# Patient Record
Sex: Female | Born: 1995 | Race: White | Hispanic: No | Marital: Married | Smoking: Former smoker
Health system: Southern US, Community
[De-identification: ages and names within clinical notes are randomized; demographics above are authoritative.]

## PROBLEM LIST (undated history)

## (undated) DIAGNOSIS — N926 Irregular menstruation, unspecified: Secondary | ICD-10-CM

## (undated) DIAGNOSIS — R0602 Shortness of breath: Secondary | ICD-10-CM

## (undated) DIAGNOSIS — M549 Dorsalgia, unspecified: Secondary | ICD-10-CM

## (undated) DIAGNOSIS — F430 Acute stress reaction: Secondary | ICD-10-CM

## (undated) DIAGNOSIS — E7800 Pure hypercholesterolemia, unspecified: Secondary | ICD-10-CM

## (undated) DIAGNOSIS — R5383 Other fatigue: Secondary | ICD-10-CM

## (undated) DIAGNOSIS — F32A Depression, unspecified: Secondary | ICD-10-CM

## (undated) DIAGNOSIS — F410 Panic disorder [episodic paroxysmal anxiety] without agoraphobia: Secondary | ICD-10-CM

## (undated) DIAGNOSIS — M238X1 Other internal derangements of right knee: Secondary | ICD-10-CM

## (undated) DIAGNOSIS — I471 Supraventricular tachycardia, unspecified: Secondary | ICD-10-CM

## (undated) DIAGNOSIS — E282 Polycystic ovarian syndrome: Secondary | ICD-10-CM

## (undated) DIAGNOSIS — K589 Irritable bowel syndrome without diarrhea: Secondary | ICD-10-CM

## (undated) DIAGNOSIS — R7303 Prediabetes: Secondary | ICD-10-CM

## (undated) DIAGNOSIS — N83209 Unspecified ovarian cyst, unspecified side: Secondary | ICD-10-CM

## (undated) DIAGNOSIS — F419 Anxiety disorder, unspecified: Secondary | ICD-10-CM

## (undated) DIAGNOSIS — F319 Bipolar disorder, unspecified: Secondary | ICD-10-CM

## (undated) DIAGNOSIS — M238X2 Other internal derangements of left knee: Secondary | ICD-10-CM

## (undated) DIAGNOSIS — F329 Major depressive disorder, single episode, unspecified: Secondary | ICD-10-CM

## (undated) HISTORY — DX: Shortness of breath: R06.02

## (undated) HISTORY — DX: Other internal derangements of right knee: M23.8X1

## (undated) HISTORY — DX: Other fatigue: R53.83

## (undated) HISTORY — DX: Dorsalgia, unspecified: M54.9

## (undated) HISTORY — DX: Polycystic ovarian syndrome: E28.2

## (undated) HISTORY — DX: Irritable bowel syndrome, unspecified: K58.9

## (undated) HISTORY — PX: NO PAST SURGERIES: SHX2092

## (undated) HISTORY — DX: Pure hypercholesterolemia, unspecified: E78.00

## (undated) HISTORY — DX: Prediabetes: R73.03

## (undated) HISTORY — DX: Irregular menstruation, unspecified: N92.6

## (undated) HISTORY — DX: Bipolar disorder, unspecified: F31.9

## (undated) HISTORY — DX: Other internal derangements of left knee: M23.8X2

---
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
## 2017-03-03 ENCOUNTER — Other Ambulatory Visit (INDEPENDENT_AMBULATORY_CARE_PROVIDER_SITE_OTHER): Payer: 59

## 2017-03-03 ENCOUNTER — Encounter: Payer: Self-pay | Admitting: Obstetrics and Gynecology

## 2017-03-03 ENCOUNTER — Ambulatory Visit (INDEPENDENT_AMBULATORY_CARE_PROVIDER_SITE_OTHER): Payer: 59 | Admitting: Obstetrics and Gynecology

## 2017-03-03 ENCOUNTER — Other Ambulatory Visit: Payer: Self-pay | Admitting: Obstetrics and Gynecology

## 2017-03-03 VITALS — BP 120/80 | HR 100 | Ht 70.0 in | Wt 176.0 lb

## 2017-03-03 DIAGNOSIS — R1032 Left lower quadrant pain: Secondary | ICD-10-CM | POA: Diagnosis not present

## 2017-03-03 DIAGNOSIS — R197 Diarrhea, unspecified: Secondary | ICD-10-CM

## 2017-03-03 DIAGNOSIS — R102 Pelvic and perineal pain: Secondary | ICD-10-CM | POA: Diagnosis not present

## 2017-03-03 DIAGNOSIS — N926 Irregular menstruation, unspecified: Secondary | ICD-10-CM | POA: Diagnosis not present

## 2017-03-03 NOTE — Progress Notes (Signed)
Chief Complaint  Patient presents with  . Pelvic Pain    HPI:      Annette Blevins is a 21 y.o. G0P0000 who LMP was Patient's last menstrual period was 01/27/2017., presents today for LLQ pain for the past wk. She is late for menses this month and has had multiple neg UPTs, including one at Urgent care yesterday. Pain is achey with sharp cramps occasionally. She has also had nausea, diarrhea and felt feverish for the past wk. No foreign travel, unusual foods, raw foods. No vag or urin sx.  Menses are usually monthly. She is sex active, no new partners since I last saw her 7/18. Conception ok and pt is taking PNVs.    Past Medical History:  Diagnosis Date  . Depression   . Irregular menses   . Panic attack as reaction to stress     Past Surgical History:  Procedure Laterality Date  . NO PAST SURGERIES      History reviewed. No pertinent family history.  Social History   Social History  . Marital status: Single    Spouse name: N/A  . Number of children: N/A  . Years of education: N/A   Occupational History  . Not on file.   Social History Main Topics  . Smoking status: Current Some Day Smoker  . Smokeless tobacco: Never Used  . Alcohol use Yes  . Drug use: Yes    Types: Marijuana     Comment: FORMER  . Sexual activity: Yes    Birth control/ protection: None   Other Topics Concern  . Not on file   Social History Narrative  . No narrative on file    No current outpatient prescriptions on file.   ROS:  Review of Systems  Constitutional: Positive for fever. Negative for unexpected weight change.  Gastrointestinal: Positive for diarrhea and nausea. Negative for blood in stool, constipation and vomiting.  Genitourinary: Positive for pelvic pain. Negative for dyspareunia, dysuria, flank pain, frequency, hematuria, urgency, vaginal bleeding, vaginal discharge and vaginal pain.  Musculoskeletal: Negative for back pain.  Skin: Negative for rash.      OBJECTIVE:   Vitals:  BP 120/80   Pulse 100   Ht 5\' 10"  (1.778 m)   Wt 176 lb (79.8 kg)   LMP 01/27/2017   BMI 25.25 kg/m   Physical Exam  Constitutional: She is oriented to person, place, and time and well-developed, well-nourished, and in no distress. Vital signs are normal.  Abdominal: Soft. Normal appearance. There is generalized tenderness and tenderness in the left lower quadrant. There is no rebound.  Genitourinary: Vagina normal, uterus normal, cervix normal, right adnexa normal and vulva normal. Uterus is not enlarged. Cervix exhibits no motion tenderness and no tenderness. Right adnexum displays no mass and no tenderness. Left adnexum displays tenderness. Left adnexum displays no mass. Vulva exhibits no erythema, no exudate, no lesion, no rash and no tenderness. Vagina exhibits no lesion.  Neurological: She is oriented to person, place, and time.  Vitals reviewed.   Results: GYN U/S-->    Assessment/Plan: LLQ pain - Tender on exam. GYN u/s with small amt free fluid, no ovar path. Question GI related. F/u prn.   Late menses - Neg UPTs. See if menses comes late or next month. F/u if no menses next mo for provera Rx after neg UPT. Conception ok.  Diarrhea, unspecified type - F/u with PCP prn sx.     Return if symptoms worsen or fail to  improve.  Goku Harb B. Corinthian Kemler, PA-C 03/03/2017 11:56 AM

## 2017-04-19 ENCOUNTER — Telehealth: Payer: Self-pay | Admitting: Obstetrics and Gynecology

## 2017-04-19 ENCOUNTER — Other Ambulatory Visit: Payer: Self-pay | Admitting: Obstetrics and Gynecology

## 2017-04-19 MED ORDER — LEVONORGESTREL-ETHINYL ESTRAD 0.1-20 MG-MCG PO TABS: 1.0000 | ORAL_TABLET | Freq: Every day | ORAL | 2 refills | Status: DC

## 2017-04-19 NOTE — Telephone Encounter (Signed)
Spoke with pt. Wants monthly OCPs. Rx aviane. Start with next menses. Back up for 1 mo. F/u prn.

## 2017-04-19 NOTE — Progress Notes (Signed)
OCP start with next menses for [facility]. Rx aviane. F/u prn.

## 2017-04-19 NOTE — Telephone Encounter (Signed)
Pt is calling to speak with Annette Blevins. Pt is thinking about change her birthcontrol and would like for Dr. Patsy Lageropland to call her. Please advise

## 2017-06-19 ENCOUNTER — Encounter: Payer: Self-pay | Admitting: Emergency Medicine

## 2017-06-19 DIAGNOSIS — R1013 Epigastric pain: Secondary | ICD-10-CM | POA: Diagnosis present

## 2017-06-19 DIAGNOSIS — F17200 Nicotine dependence, unspecified, uncomplicated: Secondary | ICD-10-CM | POA: Insufficient documentation

## 2017-06-19 DIAGNOSIS — K2900 Acute gastritis without bleeding: Secondary | ICD-10-CM | POA: Diagnosis not present

## 2017-06-19 DIAGNOSIS — R1012 Left upper quadrant pain: Secondary | ICD-10-CM | POA: Diagnosis not present

## 2017-06-19 DIAGNOSIS — Z79899 Other long term (current) drug therapy: Secondary | ICD-10-CM | POA: Insufficient documentation

## 2017-06-19 LAB — URINALYSIS, COMPLETE (UACMP) WITH MICROSCOPIC
BILIRUBIN URINE: NEGATIVE
Bacteria, UA: NONE SEEN
Glucose, UA: NEGATIVE mg/dL
Ketones, ur: 5 mg/dL — AB
Leukocytes, UA: NEGATIVE
Nitrite: NEGATIVE
PROTEIN: NEGATIVE mg/dL
Specific Gravity, Urine: 1.008 (ref 1.005–1.030)
pH: 5 (ref 5.0–8.0)

## 2017-06-19 LAB — COMPREHENSIVE METABOLIC PANEL
ALK PHOS: 75 U/L (ref 38–126)
ALT: 15 U/L (ref 14–54)
ANION GAP: 8 (ref 5–15)
AST: 22 U/L (ref 15–41)
Albumin: 4.5 g/dL (ref 3.5–5.0)
BUN: 12 mg/dL (ref 6–20)
CALCIUM: 9.7 mg/dL (ref 8.9–10.3)
CO2: 25 mmol/L (ref 22–32)
Chloride: 105 mmol/L (ref 101–111)
Creatinine, Ser: 0.76 mg/dL (ref 0.44–1.00)
GFR calc non Af Amer: >60 mL/min (ref >60)
Glucose, Bld: 107 mg/dL — ABNORMAL HIGH (ref 65–99)
POTASSIUM: 3.8 mmol/L (ref 3.5–5.1)
SODIUM: 138 mmol/L (ref 135–145)
Total Bilirubin: 0.6 mg/dL (ref 0.3–1.2)
Total Protein: 7.9 g/dL (ref 6.5–8.1)

## 2017-06-19 LAB — CBC
HEMATOCRIT: 42.4 % (ref 35.0–47.0)
HEMOGLOBIN: 14.5 g/dL (ref 12.0–16.0)
MCH: 29.3 pg (ref 26.0–34.0)
MCHC: 34.2 g/dL (ref 32.0–36.0)
MCV: 85.9 fL (ref 80.0–100.0)
Platelets: 303 10*3/uL (ref 150–440)
RBC: 4.94 MIL/uL (ref 3.80–5.20)
RDW: 13.8 % (ref 11.5–14.5)
WBC: 7 10*3/uL (ref 3.6–11.0)

## 2017-06-19 LAB — LIPASE, BLOOD: Lipase: 29 U/L (ref 11–51)

## 2017-06-19 LAB — POCT PREGNANCY, URINE: PREG TEST UR: NEGATIVE

## 2017-06-19 NOTE — ED Triage Notes (Signed)
Patient with complaint of upper abdominal pain that started last night. Patient reports vomiting times one and one loose stool.

## 2017-06-20 ENCOUNTER — Emergency Department
Admission: EM | Admit: 2017-06-20 | Discharge: 2017-06-20 | Disposition: A | Discharge status: Home / Self Care | Payer: 59 | Attending: Emergency Medicine | Admitting: Emergency Medicine

## 2017-06-20 ENCOUNTER — Emergency Department: Payer: 59

## 2017-06-20 DIAGNOSIS — R1013 Epigastric pain: Secondary | ICD-10-CM

## 2017-06-20 DIAGNOSIS — K2900 Acute gastritis without bleeding: Secondary | ICD-10-CM

## 2017-06-20 HISTORY — DX: Unspecified ovarian cyst, unspecified side: N83.209

## 2017-06-20 LAB — MONONUCLEOSIS SCREEN: MONO SCREEN: NEGATIVE

## 2017-06-20 MED ORDER — DEXTROSE-NACL 5-0.45 % IV SOLN
INTRAVENOUS | Status: DC
  Administered 2017-06-20: 02:00:00 via INTRAVENOUS

## 2017-06-20 MED ORDER — FAMOTIDINE IN NACL 20-0.9 MG/50ML-% IV SOLN
20.0000 mg | Freq: Once | INTRAVENOUS | Status: AC
  Administered 2017-06-20: 20 mg via INTRAVENOUS
  Filled 2017-06-20: qty 50

## 2017-06-20 MED ORDER — FAMOTIDINE 20 MG PO TABS: 20.0000 mg | ORAL_TABLET | Freq: Two times a day (BID) | ORAL | 0 refills | Status: DC

## 2017-06-20 MED ORDER — ONDANSETRON HCL 4 MG/2ML IJ SOLN
4.0000 mg | Freq: Once | INTRAMUSCULAR | Status: AC
  Administered 2017-06-20: 4 mg via INTRAVENOUS
  Filled 2017-06-20: qty 2

## 2017-06-20 NOTE — Discharge Instructions (Signed)
1.  Start Pepcid 20 mg twice daily. 2.  Eat a bland diet for the next 5 days, then slowly advance diet as tolerated.  Avoid heavy, greasy, spicy foods and alcohol. 3.  Return to the ER for worsening symptoms, persistent vomiting, difficulty breathing or other concerns.

## 2017-06-20 NOTE — ED Notes (Signed)
Reviewed discharge instructions, follow-up care, and prescriptions with patient. Patient verbalized understanding of all information reviewed. Patient stable, with no distress noted at this time.    

## 2017-06-20 NOTE — ED Provider Notes (Signed)
[facility] Emergency Department Provider Note   ____________________________________________   First MD Initiated Contact with Patient 06/20/17 0124     (approximate)  I have reviewed the triage vital signs and the nursing notes.   HISTORY  Chief Complaint Abdominal Pain    HPI Annette Blevins is a 22 y.o. female who presents to the ED from home with a chief complaint of abdominal pain.  Patient reports epigastric and left upper quadrant abdominal pain which began last night.  Vomited once and had one loose stool today.  Denies associated fever, chills, chest pain, shortness of breath, dysuria.  Denies recent travel or trauma.  Pain worse with eating.   Past Medical History:  Diagnosis Date  . Depression   . Irregular menses   . Ovarian cyst   . Panic attack as reaction to stress     Patient Active Problem List   Diagnosis Date Noted  . LLQ pain 03/03/2017  . Blood type O- 12/14/2016    Past Surgical History:  Procedure Laterality Date  . NO PAST SURGERIES      Prior to Admission medications   Medication Sig Start Date End Date Taking? Authorizing Provider  famotidine (PEPCID) 20 MG tablet Take 1 tablet (20 mg total) by mouth 2 (two) times daily. 06/20/17   Irean Hong, MD  levonorgestrel-ethinyl estradiol Janann Colonel) 0.1-20 MG-MCG tablet Take 1 tablet by mouth daily. 04/19/17   Copland, Alicia B, PA-C    Allergies Latex  No family history on file.  Social History Social History   Tobacco Use  . Smoking status: Current Some Day Smoker  . Smokeless tobacco: Never Used  Substance Use Topics  . Alcohol use: Yes  . Drug use: Yes    Types: Marijuana    Comment: FORMER    Review of Systems  Constitutional: No fever/chills. Eyes: No visual changes. ENT: No sore throat. Cardiovascular: Denies chest pain. Respiratory: Denies shortness of breath. Gastrointestinal: Positive for abdominal pain, nausea, vomiting and diarrhea.  No  constipation. Genitourinary: Negative for dysuria. Musculoskeletal: Negative for back pain. Skin: Negative for rash. Neurological: Negative for headaches, focal weakness or numbness.   ____________________________________________   PHYSICAL EXAM:  VITAL SIGNS: ED Triage Vitals  Enc Vitals Group     BP 06/19/17 1950 137/82     Pulse Rate 06/19/17 1950 (!) 113     Resp 06/19/17 1950 18     Temp 06/19/17 1950 98.3 F (36.8 C)     Temp Source 06/19/17 1950 Oral     SpO2 06/19/17 1950 100 %     Weight 06/19/17 1951 170 lb (77.1 kg)     Height 06/19/17 1951 5\' 10"  (1.778 m)     Head Circumference --      Peak Flow --      Pain Score 06/19/17 1951 7     Pain Loc --      Pain Edu? --      Excl. in GC? --     Constitutional: Alert and oriented. Well appearing and in no acute distress. Eyes: Conjunctivae are normal. PERRL. EOMI. Head: Atraumatic. Nose: No congestion/rhinnorhea. Mouth/Throat: Mucous membranes are moist.  Oropharynx non-erythematous. Neck: No stridor.   Cardiovascular: Normal rate, regular rhythm. Grossly normal heart sounds.  Good peripheral circulation. Respiratory: Normal respiratory effort.  No retractions. Lungs CTAB. Gastrointestinal: Soft and mildly tender to palpation epigastrium without rebound or guarding. No distention. No abdominal bruits. No CVA tenderness. Musculoskeletal: No lower extremity tenderness  nor edema.  No joint effusions. Neurologic:  Normal speech and language. No gross focal neurologic deficits are appreciated. No gait instability. Skin:  Skin is warm, dry and intact. No rash noted. Psychiatric: Mood and affect are normal. Speech and behavior are normal.  ____________________________________________   LABS (all labs ordered are listed, but only abnormal results are displayed)  Labs Reviewed  COMPREHENSIVE METABOLIC PANEL - Abnormal; Notable for the following components:      Result Value   Glucose, Bld 107 (*)    All other  components within normal limits  URINALYSIS, COMPLETE (UACMP) WITH MICROSCOPIC - Abnormal; Notable for the following components:   Color, Urine STRAW (*)    APPearance CLEAR (*)    Hgb urine dipstick SMALL (*)    Ketones, ur 5 (*)    Squamous Epithelial / LPF 0-5 (*)    All other components within normal limits  LIPASE, BLOOD  CBC  MONONUCLEOSIS SCREEN  POCT PREGNANCY, URINE  POC URINE PREG, ED   ____________________________________________  EKG  ED ECG REPORT I, Clora Ohmer J, the attending physician, personally viewed and interpreted this ECG.   Date: 06/20/2017  EKG Time: 0114  Rate: 88  Rhythm: normal EKG, normal sinus rhythm  Axis: Normal  Intervals:none  ST&T Change: Nonspecific  ____________________________________________  RADIOLOGY  ED MD interpretation: Normal ultrasound  Official radiology report(s): US Abdomen Limited Ruq  Result Date: 06/20/2017 CLINICAL DATA:  Epigastric pain, nausea, and vomiting for 12 hours. EXAM: ULTRASOUND ABDOMEN LIMITED RIGHT UPPER QUADRANT COMPARISON:  None. FINDINGS: Gallbladder: No gallstones or wall thickening visualized. No sonographic Murphy sign noted by sonographer. Common bile duct: Diameter: 2 mm, normal Liver: No focal lesion identified. Within normal limits in parenchymal echogenicity. Portal vein is patent on color Doppler imaging with normal direction of blood flow towards the liver. IMPRESSION: Normal examination.  No evidence of cholelithiasis or cholecystitis. Electronically Signed   By: Burman Nieves M.D.   On: 06/20/2017 03:13    ____________________________________________   PROCEDURES  Procedure(s) performed: None  Procedures  Critical Care performed: No  ____________________________________________   INITIAL IMPRESSION / ASSESSMENT AND PLAN / ED COURSE  As part of my medical decision making, I reviewed the following data within the electronic MEDICAL RECORD NUMBER Nursing notes reviewed and incorporated,  Labs reviewed, EKG interpreted, Old chart reviewed, Radiograph reviewed and Notes from prior ED visits.   22 year old female who presents with epigastric abdominal pain. Differential diagnosis includes, but is not limited to, biliary disease (biliary colic, acute cholecystitis, cholangitis, choledocholithiasis, etc), intrathoracic causes for epigastric abdominal pain including ACS, gastritis, duodenitis, pancreatitis, small bowel or large bowel obstruction, abdominal aortic aneurysm, hernia, and gastritis.  Laboratory and urinalysis results remarkable for mild dehydration.  Will initiate IV fluid resuscitation, IV Pepcid, antiemetic and obtain right upper quadrant ultrasound to evaluate for cholecystitis.  Clinical Course as of Jun 20 714  Wynelle Link Jun 20, 2017  0425 Patient sleeping in no acute distress.  Awakened to update her of laboratory and ultrasound results.  Will discharge home on Pepcid twice daily, and patient will follow up with her PCP.  Strict return precautions given.  Patient and family member verbalize understanding and agree with plan of care.  [JS]    Clinical Course User Index [JS] Irean Hong, MD     ____________________________________________   FINAL CLINICAL IMPRESSION(S) / ED DIAGNOSES  Final diagnoses:  Epigastric pain  Acute gastritis without hemorrhage, unspecified gastritis type     ED Discharge Orders  Ordered    famotidine (PEPCID) 20 MG tablet  2 times daily     06/20/17 0426       Note:  This document was prepared using Dragon voice recognition software and may include unintentional dictation errors.    Irean HongSung, Daneya Hartgrove J, MD 06/20/17 [phone]

## 2017-06-20 NOTE — ED Notes (Signed)
Patient transported to Ultrasound 

## 2017-06-20 NOTE — ED Notes (Signed)
Patient c/o severe ULQ abdominal pain beginning Friday night. Patient reports pain worsens with eating and standing. Patient reports 1 episode of diarrhea in the last 24 hours. Patient reports 1 emesis prior to arrival to this ED in the last 24 hours. This RN witness 1 emesis in the patient room since arrival. Patient reports decreased urination. Patient denies dysuria.

## 2018-02-23 ENCOUNTER — Ambulatory Visit: Payer: 59 | Admitting: Obstetrics and Gynecology

## 2018-03-08 ENCOUNTER — Encounter: Payer: Self-pay | Admitting: Obstetrics and Gynecology

## 2018-03-08 ENCOUNTER — Other Ambulatory Visit (HOSPITAL_COMMUNITY)
Admission: RE | Admit: 2018-03-08 | Discharge: 2018-03-08 | Disposition: A | Discharge status: Home / Self Care | Payer: 59 | Source: Ambulatory Visit | Attending: Obstetrics and Gynecology | Admitting: Obstetrics and Gynecology

## 2018-03-08 ENCOUNTER — Ambulatory Visit (INDEPENDENT_AMBULATORY_CARE_PROVIDER_SITE_OTHER): Payer: 59 | Admitting: Obstetrics and Gynecology

## 2018-03-08 VITALS — BP 118/74 | HR 85 | Ht 70.0 in | Wt 202.0 lb

## 2018-03-08 DIAGNOSIS — Z3041 Encounter for surveillance of contraceptive pills: Secondary | ICD-10-CM | POA: Diagnosis not present

## 2018-03-08 DIAGNOSIS — Z113 Encounter for screening for infections with a predominantly sexual mode of transmission: Secondary | ICD-10-CM

## 2018-03-08 DIAGNOSIS — Z01419 Encounter for gynecological examination (general) (routine) without abnormal findings: Secondary | ICD-10-CM | POA: Diagnosis not present

## 2018-03-08 MED ORDER — LEVONORGEST-ETH ESTRAD 91-DAY 0.1-0.02 & 0.01 MG PO TABS: 1.0000 | ORAL_TABLET | Freq: Every day | ORAL | 4 refills | Status: DC

## 2018-03-08 NOTE — Progress Notes (Signed)
Chief Complaint  Patient presents with  . Gynecologic Exam     HPI:      [name] is a 22 y.o. G0P0000 who LMP was Patient's last menstrual period was 02/07/2018 (exact date)., presents today for her annual examination.  Her menses are monthly with OCPs, lasting 3-5 days.  Dysmenorrhea mild. She does not have intermenstrual bleeding. She is on monthly OCPs and wants to restart Q3 mo OCPs. Did amethia lo in past.   Sex activity: single partner, contraception - OCP (estrogen/progesterone).  Hx of STDs: none  There is a FH of breast cancer in her MGGM, genetic testing not indicated. There is no FH of ovarian cancer. The patient does do self-breast exams.  Tobacco use: none. Alcohol use: 2 x weekly Marijuana use twice wkly Exercise: moderately active  She does get adequate calcium and Vitamin D in her diet.    Past Medical History:  Diagnosis Date  . Depression   . Irregular menses   . Ovarian cyst   . Panic attack as reaction to stress     Past Surgical History:  Procedure Laterality Date  . NO PAST SURGERIES      Family History  Problem Relation Age of Onset  . Hypertension Father   . Hypercholesterolemia Father     Social History   Socioeconomic History  . Marital status: Single    Spouse name: Not on file  . Number of children: Not on file  . Years of education: Not on file  . Highest education level: Not on file  Occupational History  . Not on file  Social Needs  . Financial resource strain: Not on file  . Food insecurity:    Worry: Not on file    Inability: Not on file  . Transportation needs:    Medical: Not on file    Non-medical: Not on file  Tobacco Use  . Smoking status: Former Games developer  . Smokeless tobacco: Never Used  Substance and Sexual Activity  . Alcohol use: Yes  . Drug use: Yes    Types: Marijuana    Comment: FORMER  . Sexual activity: Yes    Birth control/protection: Pill  Lifestyle  . Physical activity:    Days per  week: Not on file    Minutes per session: Not on file  . Stress: Not on file  Relationships  . Social connections:    Talks on phone: Not on file    Gets together: Not on file    Attends religious service: Not on file    Active member of club or organization: Not on file    Attends meetings of clubs or organizations: Not on file    Relationship status: Not on file  . Intimate partner violence:    Fear of current or ex partner: Not on file    Emotionally abused: Not on file    Physically abused: Not on file    Forced sexual activity: Not on file  Other Topics Concern  . Not on file  Social History Narrative  . Not on file     Current Outpatient Medications:  .  Levonorgestrel-Ethinyl Estradiol (AMETHIA LO) 0.1-0.02 & 0.01 MG tablet, Take 1 tablet by mouth daily., Disp: 1 Package, Rfl: 4  ROS:  Review of Systems  Constitutional: Positive for fatigue. Negative for fever and unexpected weight change.  Respiratory: Negative for cough, shortness of breath and wheezing.   Cardiovascular: Negative for chest pain, palpitations and leg swelling.  Gastrointestinal: Positive for nausea. Negative for blood in stool, constipation and vomiting.  Endocrine: Negative for cold intolerance, heat intolerance and polyuria.  Genitourinary: Negative for dyspareunia, dysuria, flank pain, frequency, genital sores, hematuria, menstrual problem, pelvic pain, urgency, vaginal bleeding, vaginal discharge and vaginal pain.  Musculoskeletal: Negative for back pain, joint swelling and myalgias.  Skin: Negative for rash.  Neurological: Negative for dizziness, syncope, light-headedness and numbness.  Hematological: Negative for adenopathy.  Psychiatric/Behavioral: Negative for agitation, confusion, dysphoric mood, sleep disturbance and suicidal ideas. The patient is not nervous/anxious.      Objective: BP 118/74   Pulse 85   Ht 5\' 10"  (1.778 m)   Wt 202 lb (91.6 kg)   LMP 02/07/2018 (Exact Date)   BMI  28.98 kg/m    Physical Exam  Constitutional: She is oriented to person, place, and time. She appears well-developed and well-nourished.  Genitourinary: Vagina normal and uterus normal. There is no rash or tenderness on the right labia. There is no rash or tenderness on the left labia. No erythema or tenderness in the vagina. No vaginal discharge found. Right adnexum does not display mass and does not display tenderness. Left adnexum does not display mass and does not display tenderness. Cervix does not exhibit motion tenderness or polyp. Uterus is not enlarged or tender.  Neck: Normal range of motion. No thyromegaly present.  Cardiovascular: Normal rate, regular rhythm and normal heart sounds.  No murmur heard. Pulmonary/Chest: Effort normal and breath sounds normal. Right breast exhibits no mass, no nipple discharge, no skin change and no tenderness. Left breast exhibits no mass, no nipple discharge, no skin change and no tenderness.  Abdominal: Soft. There is no tenderness. There is no guarding.  Musculoskeletal: Normal range of motion.  Neurological: She is alert and oriented to person, place, and time. No cranial nerve deficit.  Psychiatric: She has a normal mood and affect. Her behavior is normal.  Vitals reviewed.   Assessment/Plan: Encounter for annual routine gynecological examination  Screening for STD (sexually transmitted disease) - Plan: Cervicovaginal ancillary only  Encounter for surveillance of contraceptive pills - OCP change back to amethia Lo. Rx eRxd. Start with menses/condoms. F/u prn. - Plan: Levonorgestrel-Ethinyl Estradiol (AMETHIA LO) 0.1-0.02 & 0.01 MG tablet            Meds ordered this encounter  Medications  . Levonorgestrel-Ethinyl Estradiol (AMETHIA LO) 0.1-0.02 & 0.01 MG tablet    Sig: Take 1 tablet by mouth daily.    Dispense:  1 Package    Refill:  4    Order Specific Question:   Supervising Provider    Answer:   Nadara Mustard [[id number]]    GYN  counsel adequate intake of calcium and vitamin D     F/U  Return in about 1 year (around 03/09/2019).  Allean Montfort B. Vonette Grosso, PA-C 03/08/2018 2:13 PM

## 2018-03-08 NOTE — Patient Instructions (Signed)
I value your feedback and entrusting us with your care. If you get a [facility] patient survey, I would appreciate you taking the time to let us know about your experience today. Thank you! 

## 2018-03-09 LAB — CERVICOVAGINAL ANCILLARY ONLY
CHLAMYDIA, DNA PROBE: NEGATIVE
Neisseria Gonorrhea: NEGATIVE
TRICH (WINDOWPATH): NEGATIVE

## 2019-05-20 ENCOUNTER — Encounter: Payer: Self-pay | Admitting: Emergency Medicine

## 2019-05-20 ENCOUNTER — Ambulatory Visit
Admission: EM | Admit: 2019-05-20 | Discharge: 2019-05-20 | Disposition: A | Discharge status: Home / Self Care | Payer: Managed Care, Other (non HMO) | Attending: Family Medicine | Admitting: Family Medicine

## 2019-05-20 ENCOUNTER — Other Ambulatory Visit: Payer: Self-pay

## 2019-05-20 DIAGNOSIS — R102 Pelvic and perineal pain: Secondary | ICD-10-CM | POA: Diagnosis present

## 2019-05-20 DIAGNOSIS — M545 Low back pain, unspecified: Secondary | ICD-10-CM

## 2019-05-20 DIAGNOSIS — R1031 Right lower quadrant pain: Secondary | ICD-10-CM | POA: Diagnosis not present

## 2019-05-20 HISTORY — DX: Supraventricular tachycardia, unspecified: I47.10

## 2019-05-20 HISTORY — DX: Supraventricular tachycardia: I47.1

## 2019-05-20 LAB — URINALYSIS, COMPLETE (UACMP) WITH MICROSCOPIC
Bilirubin Urine: NEGATIVE
Glucose, UA: NEGATIVE mg/dL
Hgb urine dipstick: NEGATIVE
Ketones, ur: 15 mg/dL — AB
Nitrite: NEGATIVE
Protein, ur: NEGATIVE mg/dL
Specific Gravity, Urine: 1.025 (ref 1.005–1.030)
pH: 7.5 (ref 5.0–8.0)

## 2019-05-20 LAB — CBC WITH DIFFERENTIAL/PLATELET
Abs Immature Granulocytes: 0.02 10*3/uL (ref 0.00–0.07)
Basophils Absolute: 0 10*3/uL (ref 0.0–0.1)
Basophils Relative: 0 %
Eosinophils Absolute: 0 10*3/uL (ref 0.0–0.5)
Eosinophils Relative: 0 %
HCT: 41.3 % (ref 36.0–46.0)
Hemoglobin: 14 g/dL (ref 12.0–15.0)
Immature Granulocytes: 0 %
Lymphocytes Relative: 23 %
Lymphs Abs: 1.3 10*3/uL (ref 0.7–4.0)
MCH: 28.6 pg (ref 26.0–34.0)
MCHC: 33.9 g/dL (ref 30.0–36.0)
MCV: 84.5 fL (ref 80.0–100.0)
Monocytes Absolute: 0.4 10*3/uL (ref 0.1–1.0)
Monocytes Relative: 7 %
Neutro Abs: 3.9 10*3/uL (ref 1.7–7.7)
Neutrophils Relative %: 70 %
Platelets: 317 10*3/uL (ref 150–400)
RBC: 4.89 MIL/uL (ref 3.87–5.11)
RDW: 13.1 % (ref 11.5–15.5)
WBC: 5.7 10*3/uL (ref 4.0–10.5)
nRBC: 0 % (ref 0.0–0.2)

## 2019-05-20 LAB — COMPREHENSIVE METABOLIC PANEL
ALT: 30 U/L (ref 0–44)
AST: 25 U/L (ref 15–41)
Albumin: 4.5 g/dL (ref 3.5–5.0)
Alkaline Phosphatase: 90 U/L (ref 38–126)
Anion gap: 9 (ref 5–15)
BUN: 8 mg/dL (ref 6–20)
CO2: 23 mmol/L (ref 22–32)
Calcium: 9.6 mg/dL (ref 8.9–10.3)
Chloride: 104 mmol/L (ref 98–111)
Creatinine, Ser: 0.78 mg/dL (ref 0.44–1.00)
GFR calc Af Amer: >60 mL/min (ref >60)
GFR calc non Af Amer: >60 mL/min (ref >60)
Glucose, Bld: 111 mg/dL — ABNORMAL HIGH (ref 70–99)
Potassium: 3.7 mmol/L (ref 3.5–5.1)
Sodium: 136 mmol/L (ref 135–145)
Total Bilirubin: 0.6 mg/dL (ref 0.3–1.2)
Total Protein: 8.2 g/dL — ABNORMAL HIGH (ref 6.5–8.1)

## 2019-05-20 LAB — WET PREP, GENITAL
Clue Cells Wet Prep HPF POC: NONE SEEN
Sperm: NONE SEEN
Trich, Wet Prep: NONE SEEN
Yeast Wet Prep HPF POC: NONE SEEN

## 2019-05-20 MED ORDER — KETOROLAC TROMETHAMINE 10 MG PO TABS: 10.0000 mg | ORAL_TABLET | Freq: Four times a day (QID) | ORAL | 0 refills | Status: DC | PRN

## 2019-05-20 NOTE — ED Triage Notes (Signed)
Patient in today c/o a 3 day history of bilateral low back pain and RLQ abdominal pain. Patient saw another Urgent Care yesterday and gave a urine for analysis and was told it was negative. Patient denies any abnormal vaginal discharge. Patient is having normal bowel movements and urination.

## 2019-05-20 NOTE — Discharge Instructions (Signed)
Labs unremarkable.  Pain medication as needed.  Follow up with GYN  Take care  Dr. Adriana Simas

## 2019-05-20 NOTE — ED Provider Notes (Signed)
[facility]    CSN: [id number] Arrival date & time: 05/20/19  1151  History   Chief Complaint Chief Complaint  Patient presents with  . Back Pain    APPT  . Abdominal Pain    RLQ   HPI  24 year old female presents with back pain and right lower quadrant/pelvic pain.  Started 3 days ago.  She reports bilateral low back pain also reports right lower quadrant pain.  She states that her pain has now moved further down.  Seems to be in her right pelvic region currently.  Denies urinary symptoms.  Normal bowel movements.  Rates her pain a 6/10 in severity.  No medications or interventions tried.  She was recently seen at another urgent care yesterday and had a negative urinalysis and negative pregnancy test.  Her last menstrual period was on November 21 or 22.  She states that she has irregular menses.  No anorexia.  No fever.  No nausea or vomiting.  No known exacerbating factors.  No other reported symptoms.  PMH, Surgical Hx, Family Hx, Social History reviewed and updated as below.  Past Medical History:  Diagnosis Date  . Depression   . Irregular menses   . Ovarian cyst   . Panic attack as reaction to stress   . SVT (supraventricular tachycardia) [facility])    Patient Active Problem List   Diagnosis Date Noted  . LLQ pain 03/03/2017  . Blood type O- 12/14/2016   Past Surgical History:  Procedure Laterality Date  . NO PAST SURGERIES     OB History    Gravida  0   Para  0   Term  0   Preterm  0   AB  0   Living  0     SAB  0   TAB  0   Ectopic  0   Multiple  0   Live Births  0            Home Medications    Prior to Admission medications   Medication Sig Start Date End Date Taking? Authorizing Provider  Levonorgestrel-Ethinyl Estradiol (AMETHIA LO) 0.1-0.02 & 0.01 MG tablet Take 1 tablet by mouth daily. 03/08/18  Yes Copland, Helmut Muster B, PA-C  ketorolac (TORADOL) 10 MG tablet Take 1 tablet (10 mg total) by mouth every 6 (six) hours as needed  for moderate pain or severe pain. 05/20/19   Tommie Sams, DO    Family History Family History  Problem Relation Age of Onset  . Hypertension Father   . Hypercholesterolemia Father   . Healthy Mother     Social History Social History   Tobacco Use  . Smoking status: Former Smoker    Quit date: 2019    Years since quitting: 2.0  . Smokeless tobacco: Never Used  Substance Use Topics  . Alcohol use: Yes    Comment: rarely  . Drug use: Not Currently    Types: Marijuana    Comment: last used 02/20/19     Allergies   Latex   Review of Systems Review of Systems  Constitutional: Negative for appetite change and fever.  Gastrointestinal: Positive for abdominal pain. Negative for constipation, diarrhea, nausea and vomiting.  Genitourinary: Positive for pelvic pain.  Musculoskeletal: Positive for back pain.   Physical Exam Triage Vital Signs ED Triage Vitals  Enc Vitals Group     BP 05/20/19 1212 (!) 123/96     Pulse Rate 05/20/19 1212 (!) 107     Resp  05/20/19 1212 18     Temp 05/20/19 1212 99.1 F (37.3 C)     Temp Source 05/20/19 1212 Oral     SpO2 05/20/19 1212 100 %     Weight 05/20/19 1212 205 lb (93 kg)     Height 05/20/19 1212 5\' 10"  (1.778 m)     Head Circumference --      Peak Flow --      Pain Score 05/20/19 1211 6     Pain Loc --      Pain Edu? --      Excl. in [place]? --    Updated Vital Signs BP (!) 123/96 (BP Location: Left Arm)   Pulse (!) 107   Temp 99.1 F (37.3 C) (Oral)   Resp 18   Ht 5\' 10"  (1.778 m)   Wt 93 kg   LMP 04/08/2019 (Approximate)   SpO2 100%   BMI 29.41 kg/m   Visual Acuity Right Eye Distance:   Left Eye Distance:   Bilateral Distance:    Right Eye Near:   Left Eye Near:    Bilateral Near:     Physical Exam Vitals and nursing note reviewed.  Constitutional:      General: She is not in acute distress.    Appearance: Normal appearance. She is not ill-appearing.  HENT:     Head: Normocephalic and atraumatic.  Eyes:      General:        Right eye: No discharge.        Left eye: No discharge.     Conjunctiva/sclera: Conjunctivae normal.  Cardiovascular:     Rate and Rhythm: Regular rhythm. Tachycardia present.     Heart sounds: No murmur.  Pulmonary:     Effort: Pulmonary effort is normal.     Breath sounds: Normal breath sounds. No wheezing, rhonchi or rales.  Abdominal:     General: There is no distension.     Palpations: Abdomen is soft.     Tenderness: There is no abdominal tenderness. There is no right CVA tenderness, left CVA tenderness, guarding or rebound.  Skin:    General: Skin is warm.     Findings: No rash.  Neurological:     Mental Status: She is alert.  Psychiatric:        Mood and Affect: Mood normal.        Behavior: Behavior normal.    UC Treatments / Results  Labs (all labs ordered are listed, but only abnormal results are displayed) Labs Reviewed  WET PREP, GENITAL - Abnormal; Notable for the following components:      Result Value   WBC, Wet Prep HPF POC MODERATE (*)    All other components within normal limits  URINALYSIS, COMPLETE (UACMP) WITH MICROSCOPIC - Abnormal; Notable for the following components:   APPearance HAZY (*)    Ketones, ur 15 (*)    Leukocytes,Ua TRACE (*)    Bacteria, UA MANY (*)    All other components within normal limits  COMPREHENSIVE METABOLIC PANEL - Abnormal; Notable for the following components:   Glucose, Bld 111 (*)    Total Protein 8.2 (*)    All other components within normal limits  CBC WITH DIFFERENTIAL/PLATELET    EKG   Radiology No results found.  Procedures Procedures (including critical care time)  Medications Ordered in UC Medications - No data to display  Initial Impression / Assessment and Plan / UC Course  I have reviewed the triage vital signs and  the nursing notes.  Pertinent labs & imaging results that were available during my care of the patient were reviewed by me and considered in my medical decision  making (see chart for details).     24 year old female presents with back pain and pelvic pain.  No evidence of acute appendicitis or acute abdomen.  Laboratory studies unremarkable.  Toradol as needed for pain. Final Clinical Impressions(s) / UC Diagnoses   Final diagnoses:  Pelvic pain  Acute bilateral low back pain without sciatica     Discharge Instructions     Labs unremarkable.  Pain medication as needed.  Follow up with GYN  Take care  Dr. Adriana Simas    ED Prescriptions    Medication Sig Dispense Auth. Provider   ketorolac (TORADOL) 10 MG tablet Take 1 tablet (10 mg total) by mouth every 6 (six) hours as needed for moderate pain or severe pain. 20 tablet Tommie Sams, DO     PDMP not reviewed this encounter.   Tommie Sams, [place] 05/20/19 1352

## 2019-05-20 NOTE — ED Triage Notes (Signed)
Patient also had a negative pregnancy test yesterday at an urgent care.

## 2019-05-28 NOTE — Progress Notes (Signed)
Chief Complaint  Patient presents with  . Gynecologic Exam    flu shot     HPI:      [name] is a 24 y.o. G0P0000 who LMP was Patient's last menstrual period was 05/24/2019 (exact date)., presents today for her annual examination.  Her menses are Q3 months with OCPs, lasting 3-4 days.  Dysmenorrhea mild. She does not have intermenstrual bleeding. She was on monthly OCPs in past and liked them better than Amethia Lo. She stopped pills a couple months ago due to bloating/abd wt gain. Would like to go back to aviane. Had RLQ pain recently and went to ED. Neg eval for appendicitis. Sx resolved.   Sex activity: single partner, contraception - nothing after stopping OCPs, but wants to restart.   Last pap: N/A due to age in past; due this yr Hx of STDs: none  There is a FH of breast cancer in her [facility], genetic testing not indicated. There is no FH of ovarian cancer. The patient does do self-breast exams.  Tobacco use: none. Alcohol use: 2 x weekly No drug use. Exercise: moderately active  She does get adequate calcium and Vitamin D in her diet. Gardasil not done. Pt wants to start.   Also getting established with PCP at [facility] today.   Past Medical History:  Diagnosis Date  . Depression   . Irregular menses   . Ovarian cyst   . Panic attack as reaction to stress   . SVT (supraventricular tachycardia) (HCC)     Past Surgical History:  Procedure Laterality Date  . NO PAST SURGERIES      Family History  Problem Relation Age of Onset  . Hypertension Father   . Hypercholesterolemia Father   . Healthy Mother     Social History   Socioeconomic History  . Marital status: Single    Spouse name: Not on file  . Number of children: Not on file  . Years of education: Not on file  . Highest education level: Not on file  Occupational History  . Not on file  Tobacco Use  . Smoking status: Former Smoker    Quit date: 2019    Years since quitting: 2.0  .  Smokeless tobacco: Never Used  Substance and Sexual Activity  . Alcohol use: Yes    Comment: rarely  . Drug use: Not Currently    Types: Marijuana    Comment: last used 02/20/19  . Sexual activity: Yes    Birth control/protection: Condom  Other Topics Concern  . Not on file  Social History Narrative  . Not on file   Social Determinants of Health   Financial Resource Strain:   . Difficulty of Paying Living Expenses: Not on file  Food Insecurity:   . Worried About Programme researcher, broadcasting/film/video in the Last Year: Not on file  . Ran Out of Food in the Last Year: Not on file  Transportation Needs:   . Lack of Transportation (Medical): Not on file  . Lack of Transportation (Non-Medical): Not on file  Physical Activity:   . Days of Exercise per Week: Not on file  . Minutes of Exercise per Session: Not on file  Stress:   . Feeling of Stress : Not on file  Social Connections:   . Frequency of Communication with Friends and Family: Not on file  . Frequency of Social Gatherings with Friends and Family: Not on file  . Attends Religious Services: Not on  file  . Active Member of Clubs or Organizations: Not on file  . Attends Archivist Meetings: Not on file  . Marital Status: Not on file  Intimate Partner Violence:   . Fear of Current or Ex-Partner: Not on file  . Emotionally Abused: Not on file  . Physically Abused: Not on file  . Sexually Abused: Not on file     Current Outpatient Medications:  .  levonorgestrel-ethinyl estradiol (AVIANE) 0.1-20 MG-MCG tablet, Take 1 tablet by mouth daily., Disp: 84 tablet, Rfl: 3  ROS:  Review of Systems  Constitutional: Positive for fatigue. Negative for fever and unexpected weight change.  Respiratory: Negative for cough, shortness of breath and wheezing.   Cardiovascular: Negative for chest pain, palpitations and leg swelling.  Gastrointestinal: Negative for blood in stool, constipation, diarrhea, nausea and vomiting.  Endocrine: Negative  for cold intolerance, heat intolerance and polyuria.  Genitourinary: Negative for dyspareunia, dysuria, flank pain, frequency, genital sores, hematuria, menstrual problem, pelvic pain, urgency, vaginal bleeding, vaginal discharge and vaginal pain.  Musculoskeletal: Negative for back pain, joint swelling and myalgias.  Skin: Negative for rash.  Neurological: Positive for headaches. Negative for dizziness, syncope, light-headedness and numbness.  Hematological: Negative for adenopathy.  Psychiatric/Behavioral: Negative for agitation, confusion, dysphoric mood, sleep disturbance and suicidal ideas. The patient is not nervous/anxious.      Objective: BP 110/70   Ht 5\' 10"  (1.778 m)   Wt 203 lb (92.1 kg)   LMP 05/24/2019 (Exact Date)   BMI 29.13 kg/m    Physical Exam Constitutional:      Appearance: She is well-developed.  Genitourinary:     Vulva, vagina, uterus, right adnexa and left adnexa normal.     No vulval lesion or tenderness noted.     No vaginal discharge, erythema or tenderness.     No cervical motion tenderness or polyp.     Uterus is not enlarged or tender.     No right or left adnexal mass present.     Right adnexa not tender.     Left adnexa not tender.  Neck:     Thyroid: No thyromegaly.  Cardiovascular:     Rate and Rhythm: Normal rate and regular rhythm.     Heart sounds: Normal heart sounds. No murmur.  Pulmonary:     Effort: Pulmonary effort is normal.     Breath sounds: Normal breath sounds.  Chest:     Breasts:        Right: No mass, nipple discharge, skin change or tenderness.        Left: No mass, nipple discharge, skin change or tenderness.  Abdominal:     Palpations: Abdomen is soft.     Tenderness: There is no abdominal tenderness. There is no guarding.  Musculoskeletal:        General: Normal range of motion.     Cervical back: Normal range of motion.  Neurological:     General: No focal deficit present.     Mental Status: She is alert and  oriented to person, place, and time.     Cranial Nerves: No cranial nerve deficit.  Skin:    General: Skin is warm and dry.  Psychiatric:        Mood and Affect: Mood normal.        Behavior: Behavior normal.        Thought Content: Thought content normal.        Judgment: Judgment normal.  Vitals reviewed.  Assessment/Plan: Encounter for annual routine gynecological examination  Cervical cancer screening - Plan: Pap UNDER 30  Encounter for surveillance of contraceptive pills - Plan: levonorgestrel-ethinyl estradiol (AVIANE) 0.1-20 MG-MCG tablet; OCP start today. Change back to aviane. Rx eRxd. Condoms. F/u prn.   Needs flu shot - Plan: Flu Vaccine QUAD 36+ mos IM (Fluarix, Quad PF)            Meds ordered this encounter  Medications  . levonorgestrel-ethinyl estradiol (AVIANE) 0.1-20 MG-MCG tablet    Sig: Take 1 tablet by mouth daily.    Dispense:  84 tablet    Refill:  3    Order Specific Question:   Supervising Provider    Answer:   Nadara Mustard [[id number]]    GYN counsel adequate intake of calcium and vitamin D. Gardasil discussed and given today.      F/U  Return in about 2 months (around 07/27/2019).  Ronelle Michie B. Inell Mimbs, PA-C 05/29/2019 10:09 AM

## 2019-05-29 ENCOUNTER — Encounter: Payer: Self-pay | Admitting: Obstetrics and Gynecology

## 2019-05-29 ENCOUNTER — Other Ambulatory Visit: Payer: Self-pay

## 2019-05-29 ENCOUNTER — Ambulatory Visit (INDEPENDENT_AMBULATORY_CARE_PROVIDER_SITE_OTHER): Payer: Managed Care, Other (non HMO) | Admitting: Obstetrics and Gynecology

## 2019-05-29 VITALS — BP 110/70 | Ht 70.0 in | Wt 203.0 lb

## 2019-05-29 DIAGNOSIS — Z01419 Encounter for gynecological examination (general) (routine) without abnormal findings: Secondary | ICD-10-CM | POA: Diagnosis not present

## 2019-05-29 DIAGNOSIS — Z23 Encounter for immunization: Secondary | ICD-10-CM

## 2019-05-29 DIAGNOSIS — Z3041 Encounter for surveillance of contraceptive pills: Secondary | ICD-10-CM

## 2019-05-29 DIAGNOSIS — Z124 Encounter for screening for malignant neoplasm of cervix: Secondary | ICD-10-CM

## 2019-05-29 MED ORDER — LEVONORGESTREL-ETHINYL ESTRAD 0.1-20 MG-MCG PO TABS: 1.0000 | ORAL_TABLET | Freq: Every day | ORAL | 3 refills | Status: DC

## 2019-05-29 NOTE — Patient Instructions (Signed)
I value your feedback and entrusting us with your care. If you get a [facility] patient survey, I would appreciate you taking the time to let us know about your experience today. Thank you!  As of April 27, 2019, your lab results will be released to your MyChart immediately, before I even have a chance to see them. Please give me time to review them and contact you if there are any abnormalities. Thank you for your patience.  

## 2019-05-30 ENCOUNTER — Telehealth: Payer: Self-pay

## 2019-05-30 NOTE — Telephone Encounter (Signed)
Pt calling making sure that correct RX was sent in yesterday. She is aware that Aviane was sent in and should be ready at pharm.

## 2019-05-31 LAB — IGP, RFX APTIMA HPV ASCU

## 2019-08-08 ENCOUNTER — Encounter: Payer: Self-pay | Admitting: Emergency Medicine

## 2019-08-08 ENCOUNTER — Other Ambulatory Visit: Payer: Self-pay

## 2019-08-08 ENCOUNTER — Ambulatory Visit
Admission: EM | Admit: 2019-08-08 | Discharge: 2019-08-08 | Disposition: A | Discharge status: Home / Self Care | Payer: Managed Care, Other (non HMO) | Attending: Family Medicine | Admitting: Family Medicine

## 2019-08-08 DIAGNOSIS — M542 Cervicalgia: Secondary | ICD-10-CM | POA: Diagnosis not present

## 2019-08-08 NOTE — Discharge Instructions (Signed)
Naproxen as needed.  If persists, have PCP refer to neurology.  Take care  Dr. Adriana Simas

## 2019-08-08 NOTE — ED Provider Notes (Signed)
[facility]    CSN: [id number] Arrival date & time: 08/08/19  1749      History   Chief Complaint Chief Complaint  Patient presents with  . Neck Pain   HPI  24 year old female presents with right-sided neck pain.  Patient reports that earlier today she experienced sharp, stabbing pain on the right side of her neck which radiated upward.  She is quite anxious about this.  Pain is currently improved.  She now feels like is an aching sensation.  She is unsure if this is related to her headaches and facial pain that she has been having.  She has seen her primary care physician twice in the past few days for headaches and facial pain.  She states that her symptoms seem to improve with naproxen.  No other reported symptoms.  No other complaints or concerns at this time.  Past Medical History:  Diagnosis Date  . Depression   . Irregular menses   . Ovarian cyst   . Panic attack as reaction to stress   . SVT (supraventricular tachycardia) [facility])     Patient Active Problem List   Diagnosis Date Noted  . LLQ pain 03/03/2017  . Blood type O- 12/14/2016    Past Surgical History:  Procedure Laterality Date  . NO PAST SURGERIES      OB History    Gravida  0   Para  0   Term  0   Preterm  0   AB  0   Living  0     SAB  0   TAB  0   Ectopic  0   Multiple  0   Live Births  0            Home Medications    Prior to Admission medications   Medication Sig Start Date End Date Taking? Authorizing Provider  levonorgestrel-ethinyl estradiol (AVIANE) 0.1-20 MG-MCG tablet Take 1 tablet by mouth daily. 05/29/19  Yes Copland, Ilona Sorrel, PA-C    Family History Family History  Problem Relation Age of Onset  . Hypertension Father   . Hypercholesterolemia Father   . Healthy Mother     Social History Social History   Tobacco Use  . Smoking status: Former Smoker    Quit date: 2019    Years since quitting: 2.2  . Smokeless tobacco: Never Used    Substance Use Topics  . Alcohol use: Yes    Comment: rarely  . Drug use: Not Currently    Types: Marijuana    Comment: last used 02/20/19     Allergies   Latex   Review of Systems Review of Systems  Constitutional: Negative.   Eyes: Negative.   Musculoskeletal: Positive for neck pain.  Neurological: Positive for headaches. Negative for weakness.  Psychiatric/Behavioral: The patient is nervous/anxious.    Physical Exam Triage Vital Signs ED Triage Vitals  Enc Vitals Group     BP 08/08/19 1800 129/80     Pulse Rate 08/08/19 1800 (!) 106     Resp 08/08/19 1800 18     Temp 08/08/19 1800 97.9 F (36.6 C)     Temp Source 08/08/19 1800 Oral     SpO2 08/08/19 1800 100 %     Weight 08/08/19 1759 205 lb (93 kg)     Height 08/08/19 1759 5\' 11"  (1.803 m)     Head Circumference --      Peak Flow --      Pain Score 08/08/19  1759 6     Pain Loc --      Pain Edu? --      Excl. in [place]? --    Updated Vital Signs BP 129/80 (BP Location: Right Arm)   Pulse (!) 106   Temp 97.9 F (36.6 C) (Oral)   Resp 18   Ht 5\' 11"  (1.803 m)   Wt 93 kg   LMP 07/18/2019   SpO2 100%   BMI 28.59 kg/m   Visual Acuity Right Eye Distance:   Left Eye Distance:   Bilateral Distance:    Right Eye Near:   Left Eye Near:    Bilateral Near:     Physical Exam Vitals and nursing note reviewed.  Constitutional:      General: She is not in acute distress.    Appearance: Normal appearance. She is not ill-appearing.  HENT:     Head: Normocephalic and atraumatic.     Nose: Nose normal.     Mouth/Throat:     Pharynx: Oropharynx is clear. No posterior oropharyngeal erythema.  Eyes:     General:        Right eye: No discharge.        Left eye: No discharge.     Conjunctiva/sclera: Conjunctivae normal.     Pupils: Pupils are equal, round, and reactive to light.  Cardiovascular:     Rate and Rhythm: Normal rate and regular rhythm.  Musculoskeletal:     Cervical back: Normal range of motion and  neck supple. No tenderness.  Neurological:     General: No focal deficit present.     Mental Status: She is alert and oriented to person, place, and time.  Psychiatric:        Mood and Affect: Mood normal.        Behavior: Behavior normal.    UC Treatments / Results  Labs (all labs ordered are listed, but only abnormal results are displayed) Labs Reviewed - No data to display  EKG   Radiology No results found.  Procedures Procedures (including critical care time)  Medications Ordered in UC Medications - No data to display  Initial Impression / Assessment and Plan / UC Course  I have reviewed the triage vital signs and the nursing notes.  Pertinent labs & imaging results that were available during my care of the patient were reviewed by me and considered in my medical decision making (see chart for details).    24 year old female presents with acute neck pain.  Patient is well-appearing.  Unremarkable exam.  I suspect that anxiety is playing a role.  Naproxen as needed.  Supportive care.  Final Clinical Impressions(s) / UC Diagnoses   Final diagnoses:  Neck pain     Discharge Instructions     Naproxen as needed.  If persists, have PCP refer to neurology.  Take care  Dr. Lacinda Axon    ED Prescriptions    None     PDMP not reviewed this encounter.   Coral Spikes, [place] 08/08/19 1905

## 2019-08-08 NOTE — ED Triage Notes (Signed)
Patient c/o right side neck pain that started 2 hours ago. She states it radiated up into her ear and the back of her head. She states it gave her a panic attack.

## 2019-08-19 ENCOUNTER — Other Ambulatory Visit
Admission: RE | Admit: 2019-08-19 | Discharge: 2019-08-19 | Disposition: A | Discharge status: Home / Self Care | Payer: Managed Care, Other (non HMO) | Source: Ambulatory Visit | Attending: Family Medicine | Admitting: Family Medicine

## 2019-08-19 DIAGNOSIS — M7989 Other specified soft tissue disorders: Secondary | ICD-10-CM | POA: Diagnosis present

## 2019-08-19 LAB — FIBRIN DERIVATIVES D-DIMER (ARMC ONLY): Fibrin derivatives D-dimer (ARMC): 293.85 ng/mL (FEU) (ref 0.00–499.00)

## 2019-11-21 ENCOUNTER — Ambulatory Visit
Admission: EM | Admit: 2019-11-21 | Discharge: 2019-11-21 | Disposition: A | Discharge status: Home / Self Care | Payer: Managed Care, Other (non HMO) | Attending: Emergency Medicine | Admitting: Emergency Medicine

## 2019-11-21 ENCOUNTER — Other Ambulatory Visit: Payer: Self-pay

## 2019-11-21 ENCOUNTER — Encounter: Payer: Self-pay | Admitting: Emergency Medicine

## 2019-11-21 DIAGNOSIS — H1031 Unspecified acute conjunctivitis, right eye: Secondary | ICD-10-CM | POA: Diagnosis not present

## 2019-11-21 HISTORY — DX: Anxiety disorder, unspecified: F41.9

## 2019-11-21 MED ORDER — CIPROFLOXACIN HCL 0.3 % OP SOLN: OPHTHALMIC | 0 refills | Status: DC

## 2019-11-21 MED ORDER — SYSTANE 0.4-0.3 % OP SOLN: 1.0000 [drp] | Freq: Four times a day (QID) | OPHTHALMIC | 0 refills | Status: DC | PRN

## 2019-11-21 MED ORDER — FLUORESCEIN SODIUM 1 MG OP STRP: 1.0000 | ORAL_STRIP | Freq: Once | OPHTHALMIC | Status: DC

## 2019-11-21 MED ORDER — TETRACAINE HCL 0.5 % OP SOLN: 1.0000 [drp] | Freq: Once | OPHTHALMIC | Status: DC

## 2019-11-21 NOTE — ED Triage Notes (Signed)
Patient in today c/o right eye redness and pain x 3 days. Patient states her eye was crusted shut this morning. Patient denies fever. Patient has not taken any OTC medications.

## 2019-11-21 NOTE — ED Provider Notes (Signed)
HPI  SUBJECTIVE:  Annette Blevins is a 24 y.o. female who presents with right-sided conjunctival injection for the past 3 or 4 days.  She reports copious amounts of yellowish discharge starting yesterday.  States her eye has been crusted shut several times.  She reports burning pain, stinging, mild photophobia.  She wears glasses, does not wear contacts.  No headache, nausea, vomiting, blurry or double vision.  No trauma, chemical exposure.  States that her cat also has conjunctivitis, no human contacts with conjunctivitis.  No pain with extraocular movements periorbital erythema, edema.  She tried hot showers with improvement in her symptoms.  No aggravating factors.  Past medical history negative for diabetes, allergies, glaucoma.  LMP: 6/24 denies the possibility of being pregnant.  PMD: Dr. Lolita Cram at [facility] primary care at [place].  Ophthalmology: None.     Past Medical History:  Diagnosis Date  . Anxiety   . Depression   . Irregular menses   . Ovarian cyst   . Panic attack as reaction to stress   . SVT (supraventricular tachycardia) (HCC)     Past Surgical History:  Procedure Laterality Date  . NO PAST SURGERIES      Family History  Problem Relation Age of Onset  . Hypertension Father   . Hypercholesterolemia Father   . Healthy Mother     Social History   Tobacco Use  . Smoking status: Former Smoker    Quit date: 2019    Years since quitting: 2.5  . Smokeless tobacco: Never Used  Vaping Use  . Vaping Use: Never used  Substance Use Topics  . Alcohol use: Yes    Comment: rarely  . Drug use: Yes    Types: Marijuana    Comment: 1-2 times per month     Current Facility-Administered Medications:  .  fluorescein ophthalmic strip 1 strip, 1 strip, Right Eye, Once, Domenick Gong, MD .  tetracaine (PONTOCAINE) 0.5 % ophthalmic solution 1 drop, 1 drop, Right Eye, Once, Domenick Gong, MD  Current Outpatient Medications:  .  sertraline (ZOLOFT) 50 MG tablet, Take  50 mg by mouth daily., Disp: , Rfl:  .  ciprofloxacin (CILOXAN) 0.3 % ophthalmic solution, 1-2 drops in affected eye 4 times/day x 5 days, Disp: 5 mL, Rfl: 0 .  Polyethyl Glycol-Propyl Glycol (SYSTANE) 0.4-0.3 % SOLN, Apply 1 drop to eye 4 (four) times daily as needed., Disp: 5 mL, Rfl: 0  Allergies  Allergen Reactions  . Latex Hives     ROS  As noted in HPI.   Physical Exam  BP 129/71 (BP Location: Right Arm)   Pulse 93   Temp 98.6 F (37 C) (Oral)   Resp 18   Ht 5\' 10"  (1.778 m)   Wt 90.7 kg   LMP 11/09/2019 (Exact Date)   SpO2 100%   BMI 28.70 kg/m   Constitutional: Well developed, well nourished, no acute distress Eyes:  EOMI, right-sided conjunctival injection. No direct or consensual photophobia. No foreign body seen on lid eversion. No discharge, drainage. No periorbital erythema, edema, tenderness. No pain with EOMs. No hyphema, corneal foreign body. No corneal abrasion.     Visual Acuity  Right Eye Distance: 20/50 Left Eye Distance: 20/30 Bilateral Distance: 20/25  Right Eye Near:   Left Eye Near:    Bilateral Near:        HENT: Normocephalic, atraumatic,mucus membranes moist Respiratory: Normal inspiratory effort Cardiovascular: Normal rate GI: nondistended skin: No rash, skin intact Musculoskeletal: no deformities Neurologic: Alert &  oriented x 3, no focal neuro deficits Psychiatric: Speech and behavior appropriate   ED Course   Medications  tetracaine (PONTOCAINE) 0.5 % ophthalmic solution 1 drop (has no administration in time range)  fluorescein ophthalmic strip 1 strip (has no administration in time range)    No orders of the defined types were placed in this encounter.   No results found for this or any previous visit (from the past 24 hour(s)). No results found.  ED Clinical Impression  1. Acute conjunctivitis of right eye, unspecified acute conjunctivitis type      ED Assessment/Plan  Suspect infectious conjunctivitis contracted  from her cat. Will send home with Cipro eyedrops, warm compresses, Systane, hand hygiene, throw away all make-up, clean make-up brushes. Follow-up with Dr. Inez Pilgrim, ophthalmology on-call, in 3 to 4 days if not getting any better. To the ER if she gets worse.  Discussed  MDM, treatment plan, and plan for follow-up with patient. Discussed sn/sx that should prompt return to the ED. patient agrees with plan.   Meds ordered this encounter  Medications  . tetracaine (PONTOCAINE) 0.5 % ophthalmic solution 1 drop  . fluorescein ophthalmic strip 1 strip  . Polyethyl Glycol-Propyl Glycol (SYSTANE) 0.4-0.3 % SOLN    Sig: Apply 1 drop to eye 4 (four) times daily as needed.    Dispense:  5 mL    Refill:  0  . ciprofloxacin (CILOXAN) 0.3 % ophthalmic solution    Sig: 1-2 drops in affected eye 4 times/day x 5 days    Dispense:  5 mL    Refill:  0    *This clinic note was created using Scientist, clinical (histocompatibility and immunogenetics). Therefore, there may be occasional mistakes despite careful proofreading.   ?    Domenick Gong, MD 11/22/19 1256

## 2019-11-21 NOTE — Discharge Instructions (Addendum)
Finish the Cipro eyedrops even if you feel better, warm compresses, Systane, strict hand hygiene, throw away all make-up, clean make-up brushes. Follow-up with Dr. Inez Pilgrim, ophthalmology on-call, in 3 to 4 days if not getting any better.

## 2020-01-31 NOTE — Progress Notes (Signed)
Patient, No Pcp Per   Chief Complaint  Patient presents with  . Amenorrhea    hasnt had a cycle since June, neg UPT's at home  . [employer] Employee    HPI:      [name] is a 24 y.o. [id number] whose LMP was Patient's last menstrual period was 10/17/2019 (approximate)., presents today for irregular menses since stopping OCPs 2/21 or 3/21. Menses have been Q50-60 days, last 3-7 days, mod flow, with BTB mid cycle for 4-5 days. Mild dysmen, no meds needed. No menses since 6/21 however, with neg UPTs. Hx of irreg menses prior to OCPs in past. Pt now with facial hair. Has increased hair in her family but shaved face this wk. Also has gained ~60# in past few yrs. Trying to do diet changes without sx control. Never had PCOS eval.  Menses were controlled with OCPs, did Q1-3 months, lasting 3-4 days.  Tried several different pills and had issues with bloating and mood changes. Feels better off pills.  She is sex active, using condoms.  Would also like Gard #3. #2 done early April.   Past Medical History:  Diagnosis Date  . Anxiety   . Depression   . Irregular menses   . Ovarian cyst   . Panic attack as reaction to stress   . SVT (supraventricular tachycardia) (HCC)     Past Surgical History:  Procedure Laterality Date  . NO PAST SURGERIES      Family History  Problem Relation Age of Onset  . Hypertension Father   . Hypercholesterolemia Father   . Healthy Mother     Social History   Socioeconomic History  . Marital status: Single    Spouse name: Not on file  . Number of children: Not on file  . Years of education: Not on file  . Highest education level: Not on file  Occupational History  . Not on file  Tobacco Use  . Smoking status: Former Smoker    Quit date: 2019    Years since quitting: 2.7  . Smokeless tobacco: Never Used  Vaping Use  . Vaping Use: Never used  Substance and Sexual Activity  . Alcohol use: Yes    Comment: rarely  . Drug use: Yes    Types:  Marijuana    Comment: 1-2 times per month  . Sexual activity: Yes    Birth control/protection: Condom  Other Topics Concern  . Not on file  Social History Narrative  . Not on file   Social Determinants of Health   Financial Resource Strain:   . Difficulty of Paying Living Expenses: Not on file  Food Insecurity:   . Worried About Programme researcher, broadcasting/film/video in the Last Year: Not on file  . Ran Out of Food in the Last Year: Not on file  Transportation Needs:   . Lack of Transportation (Medical): Not on file  . Lack of Transportation (Non-Medical): Not on file  Physical Activity:   . Days of Exercise per Week: Not on file  . Minutes of Exercise per Session: Not on file  Stress:   . Feeling of Stress : Not on file  Social Connections:   . Frequency of Communication with Friends and Family: Not on file  . Frequency of Social Gatherings with Friends and Family: Not on file  . Attends Religious Services: Not on file  . Active Member of Clubs or Organizations: Not on file  . Attends Banker Meetings:  Not on file  . Marital Status: Not on file  Intimate Partner Violence:   . Fear of Current or Ex-Partner: Not on file  . Emotionally Abused: Not on file  . Physically Abused: Not on file  . Sexually Abused: Not on file    Outpatient Medications Prior to Visit  Medication Sig Dispense Refill  . sertraline (ZOLOFT) 50 MG tablet Take 50 mg by mouth daily.    . ciprofloxacin (CILOXAN) 0.3 % ophthalmic solution 1-2 drops in affected eye 4 times/day x 5 days 5 mL 0  . Polyethyl Glycol-Propyl Glycol (SYSTANE) 0.4-0.3 % SOLN Apply 1 drop to eye 4 (four) times daily as needed. 5 mL 0   No facility-administered medications prior to visit.      ROS:  Review of Systems  Constitutional: Negative for fever.  Gastrointestinal: Negative for blood in stool, constipation, diarrhea, nausea and vomiting.  Genitourinary: Positive for menstrual problem. Negative for dyspareunia, dysuria,  flank pain, frequency, hematuria, urgency, vaginal bleeding, vaginal discharge and vaginal pain.  Musculoskeletal: Negative for back pain.  Skin: Negative for rash.  Psychiatric/Behavioral: Positive for agitation and dysphoric mood.    OBJECTIVE:   Vitals:  BP 120/80   Ht 5\' 11"  (1.803 m)   Wt 220 lb (99.8 kg)   LMP 10/17/2019 (Approximate)   BMI 30.68 kg/m   Physical Exam Vitals reviewed.  Constitutional:      Appearance: She is well-developed.  Pulmonary:     Effort: Pulmonary effort is normal.  Musculoskeletal:        General: Normal range of motion.     Cervical back: Normal range of motion.  Skin:    General: Skin is warm and dry.  Neurological:     General: No focal deficit present.     Mental Status: She is alert and oriented to person, place, and time.     Cranial Nerves: No cranial nerve deficit.  Psychiatric:        Mood and Affect: Mood normal.        Behavior: Behavior normal.        Thought Content: Thought content normal.        Judgment: Judgment normal.    Results for orders placed or performed in visit on 02/01/20 (from the past 24 hour(s))  POCT urine pregnancy     Status: Normal   Collection Time: 02/01/20 12:05 PM  Result Value Ref Range   Preg Test, Ur Negative Negative    Assessment/Plan: Amenorrhea - Plan: Prolactin, POCT urine pregnancy, medroxyPROGESTERone (PROVERA) 10 MG tablet, FSH/LH, Hemoglobin A1c, Progesterone, Testosterone,Free and Total, DHEA-sulfate, Androstenedione, Estradiol, TSH + free T4;   Check PCOS labs. Will f/u with results. Neg UPT, Rx provera today. Discussed importance of menses Q3 months. Pt may want to do Q90 days menses with provera instead of BC. Also diet/wt loss changes. Discussed benefits of OCPs and PCOS.  Weight loss counseling, encounter for--recommended low carb diet due to possible insulin resistance.   Need for HPV vaccine - Plan: HPV 9-valent vaccine,Recombinat    Meds ordered this encounter    Medications  . medroxyPROGESTERone (PROVERA) 10 MG tablet    Sig: Take 1 tablet (10 mg total) by mouth daily for 7 days.    Dispense:  7 tablet    Refill:  0    Order Specific Question:   Supervising Provider    Answer:   02/03/20 Nadara Mustard      Return if symptoms worsen or fail to improve.  Tianni Escamilla B. Dalante Minus, PA-C 02/01/2020 12:04 PM

## 2020-02-01 ENCOUNTER — Other Ambulatory Visit: Payer: Self-pay

## 2020-02-01 ENCOUNTER — Encounter: Payer: Self-pay | Admitting: Obstetrics and Gynecology

## 2020-02-01 ENCOUNTER — Ambulatory Visit (INDEPENDENT_AMBULATORY_CARE_PROVIDER_SITE_OTHER): Payer: Managed Care, Other (non HMO) | Admitting: Obstetrics and Gynecology

## 2020-02-01 VITALS — BP 120/80 | Ht 71.0 in | Wt 220.0 lb

## 2020-02-01 DIAGNOSIS — N912 Amenorrhea, unspecified: Secondary | ICD-10-CM | POA: Diagnosis not present

## 2020-02-01 DIAGNOSIS — Z23 Encounter for immunization: Secondary | ICD-10-CM

## 2020-02-01 DIAGNOSIS — Z713 Dietary counseling and surveillance: Secondary | ICD-10-CM | POA: Diagnosis not present

## 2020-02-01 LAB — POCT URINE PREGNANCY: Preg Test, Ur: NEGATIVE

## 2020-02-01 MED ORDER — MEDROXYPROGESTERONE ACETATE 10 MG PO TABS: 10.0000 mg | ORAL_TABLET | Freq: Every day | ORAL | 0 refills | Status: DC

## 2020-02-01 NOTE — Patient Instructions (Signed)
I value your feedback and entrusting us with your care. If you get a [facility] patient survey, I would appreciate you taking the time to let us know about your experience today. Thank you!  As of April 27, 2019, your lab results will be released to your MyChart immediately, before I even have a chance to see them. Please give me time to review them and contact you if there are any abnormalities. Thank you for your patience.  

## 2020-02-07 ENCOUNTER — Telehealth: Payer: Self-pay | Admitting: Obstetrics and Gynecology

## 2020-02-07 LAB — PROGESTERONE: Progesterone: 0.2 ng/mL

## 2020-02-07 LAB — PROLACTIN: Prolactin: 13.6 ng/mL (ref 4.8–23.3)

## 2020-02-07 LAB — HEMOGLOBIN A1C
Est. average glucose Bld gHb Est-mCnc: 105 mg/dL
Hgb A1c MFr Bld: 5.3 % (ref 4.8–5.6)

## 2020-02-07 LAB — FSH/LH
FSH: 4.9 m[IU]/mL
LH: 15.8 m[IU]/mL

## 2020-02-07 LAB — DHEA-SULFATE: DHEA-SO4: 322 ug/dL (ref 110.0–431.7)

## 2020-02-07 LAB — TSH+FREE T4
Free T4: 1.16 ng/dL (ref 0.82–1.77)
TSH: 1.86 u[IU]/mL (ref 0.450–4.500)

## 2020-02-07 LAB — TESTOSTERONE,FREE AND TOTAL
Testosterone, Free: 5.9 pg/mL — ABNORMAL HIGH (ref 0.0–4.2)
Testosterone: 47 ng/dL (ref 13–71)

## 2020-02-07 LAB — ESTRADIOL: Estradiol: 99.5 pg/mL

## 2020-02-07 LAB — ANDROSTENEDIONE: Androstenedione: 157 ng/dL (ref 41–262)

## 2020-02-07 MED ORDER — DROSPIRENONE-ETHINYL ESTRADIOL 3-0.02 MG PO TABS: 1.0000 | ORAL_TABLET | Freq: Every day | ORAL | 1 refills | Status: DC

## 2020-02-07 NOTE — Telephone Encounter (Signed)
Spoke with pt re: PCOS labs. Has mildly elevated free testosterone, suggestive of PCOS and cause of her hirsutism. Pt did OCPs in past (norgestimate, norethindrone, levonorgestrel) with mood changes. Would like to try different OCPs. Will try yaz, Rx eRxd. Pt taking provera, OCP start with withdrawal bleed. Condoms. F/u prn moods.

## 2020-03-20 ENCOUNTER — Ambulatory Visit (HOSPITAL_COMMUNITY)
Admission: EM | Admit: 2020-03-20 | Discharge: 2020-03-20 | Disposition: A | Discharge status: Home / Self Care | Payer: Managed Care, Other (non HMO) | Attending: Emergency Medicine | Admitting: Emergency Medicine

## 2020-03-20 ENCOUNTER — Encounter (HOSPITAL_COMMUNITY): Payer: Self-pay

## 2020-03-20 ENCOUNTER — Other Ambulatory Visit: Payer: Self-pay

## 2020-03-20 ENCOUNTER — Ambulatory Visit (INDEPENDENT_AMBULATORY_CARE_PROVIDER_SITE_OTHER): Payer: Managed Care, Other (non HMO)

## 2020-03-20 DIAGNOSIS — X501XXA Overexertion from prolonged static or awkward postures, initial encounter: Secondary | ICD-10-CM

## 2020-03-20 DIAGNOSIS — M25562 Pain in left knee: Secondary | ICD-10-CM

## 2020-03-20 NOTE — Discharge Instructions (Signed)
X-ray normal, follow-up with sports medicine for further evaluation and imaging of knee Wear knee brace for extra support if needed Ice and elevate Anti-inflammatories for pain and swelling Follow-up for any concerns

## 2020-03-20 NOTE — ED Triage Notes (Signed)
Pt is here with left knee pain that started at 1pm after she tried to lift her leg to put under her while sitting, she heard a loud crack and states she cant bend/swat since. Pt has not taken any meds to relieve discomfort.

## 2020-03-21 NOTE — ED Provider Notes (Signed)
[facility]    CSN: [id number] Arrival date & time: 03/20/20  1617      History   Chief Complaint Chief Complaint  Patient presents with  . Knee Pain    HPI Annette Blevins is a 24 y.o. female presenting today for evaluation of left knee injury.  Patient reports that around 1 PM she attempted to place her left leg under her other leg while sitting, and doing so she felt a popping and grinding sensation in her knee.  Since she has had pain pressure which significantly worsens with deep bending/squatting.  Denies history of similar.    HPI  Past Medical History:  Diagnosis Date  . Anxiety   . Depression   . Irregular menses   . Ovarian cyst   . Panic attack as reaction to stress   . SVT (supraventricular tachycardia) [facility])     Patient Active Problem List   Diagnosis Date Noted  . LLQ pain 03/03/2017  . Blood type O- 12/14/2016    Past Surgical History:  Procedure Laterality Date  . NO PAST SURGERIES      OB History    Gravida  0   Para  0   Term  0   Preterm  0   AB  0   Living  0     SAB  0   TAB  0   Ectopic  0   Multiple  0   Live Births  0            Home Medications    Prior to Admission medications   Medication Sig Start Date End Date Taking? Authorizing Provider  drospirenone-ethinyl estradiol (YAZ) 3-0.02 MG tablet Take 1 tablet by mouth daily. 02/07/20   Copland, Ilona Sorrel, PA-C  medroxyPROGESTERone (PROVERA) 10 MG tablet Take 1 tablet (10 mg total) by mouth daily for 7 days. 02/01/20 02/08/20  Copland, Ilona Sorrel, PA-C  sertraline (ZOLOFT) 50 MG tablet Take 50 mg by mouth daily.    [provider]  levonorgestrel-ethinyl estradiol (AVIANE) 0.1-20 MG-MCG tablet Take 1 tablet by mouth daily. 05/29/19 11/21/19  Copland, Ilona Sorrel, PA-C    Family History Family History  Problem Relation Age of Onset  . Hypertension Father   . Hypercholesterolemia Father   . Healthy Mother     Social History Social History    Tobacco Use  . Smoking status: Former Smoker    Quit date: 2019    Years since quitting: 2.8  . Smokeless tobacco: Never Used  Vaping Use  . Vaping Use: Never used  Substance Use Topics  . Alcohol use: Yes    Comment: rarely  . Drug use: Yes    Types: Marijuana     Allergies   Latex and Other   Review of Systems Review of Systems  Constitutional: Negative for fatigue and fever.  Eyes: Negative for visual disturbance.  Respiratory: Negative for shortness of breath.   Cardiovascular: Negative for chest pain.  Gastrointestinal: Negative for abdominal pain, nausea and vomiting.  Musculoskeletal: Positive for arthralgias. Negative for joint swelling.  Skin: Negative for color change, rash and wound.  Neurological: Negative for dizziness, weakness, light-headedness and headaches.     Physical Exam Triage Vital Signs ED Triage Vitals  Enc Vitals Group     BP 03/20/20 1745 129/78     Pulse Rate 03/20/20 1745 93     Resp 03/20/20 1745 19     Temp 03/20/20 1745 98.8 F (37.1 C)  Temp Source 03/20/20 1745 Oral     SpO2 03/20/20 1745 100 %     Weight --      Height --      Head Circumference --      Peak Flow --      Pain Score 03/20/20 1743 4     Pain Loc --      Pain Edu? --      Excl. in GC? --    No data found.  Updated Vital Signs BP 129/78 (BP Location: Right Arm)   Pulse 93   Temp 98.8 F (37.1 C) (Oral)   Resp 19   LMP 03/18/2020   SpO2 100%   Visual Acuity Right Eye Distance:   Left Eye Distance:   Bilateral Distance:    Right Eye Near:   Left Eye Near:    Bilateral Near:     Physical Exam Vitals and nursing note reviewed.  Constitutional:      Appearance: She is well-developed.     Comments: No acute distress  HENT:     Head: Normocephalic and atraumatic.     Nose: Nose normal.  Eyes:     Conjunctiva/sclera: Conjunctivae normal.  Cardiovascular:     Rate and Rhythm: Normal rate.  Pulmonary:     Effort: Pulmonary effort is  normal. No respiratory distress.  Abdominal:     General: There is no distension.  Musculoskeletal:        General: Normal range of motion.     Cervical back: Neck supple.     Comments: Left knee: No obvious swelling or deformity, mild tenderness to palpation over patella, increased tenderness to palpation of the lateral joint line, no popliteal tenderness or medial joint line tenderness, nontender suprapatellar area or infrapatellar area, full active range of motion with flexion and extension, no laxity appreciated with varus and valgus stress, negative Lachman's  Skin:    General: Skin is warm and dry.  Neurological:     Mental Status: She is alert and oriented to person, place, and time.      UC Treatments / Results  Labs (all labs ordered are listed, but only abnormal results are displayed) Labs Reviewed - No data to display  EKG   Radiology DG Knee Complete 4 Views Left  Result Date: 03/20/2020 CLINICAL DATA:  Twisting injury with audible pop, initial encounter EXAM: LEFT KNEE - COMPLETE 4+ VIEW COMPARISON:  02/06/2011 FINDINGS: No evidence of fracture, dislocation, or joint effusion. No evidence of arthropathy or other focal bone abnormality. Soft tissues are unremarkable. IMPRESSION: No acute abnormality noted. Electronically Signed   By: Alcide Clever M.D.   On: 03/20/2020 18:24    Procedures Procedures (including critical care time)  Medications Ordered in UC Medications - No data to display  Initial Impression / Assessment and Plan / UC Course  I have reviewed the triage vital signs and the nursing notes.  Pertinent labs & imaging results that were available during my care of the patient were reviewed by me and considered in my medical decision making (see chart for details).     X-ray negative for acute bony abnormality, given mechanism of injury with associated popping/tearing sensation cannot rule out underlying soft tissue injury.  Discussed possible meniscal  tear given lateral tenderness.  Placing an knee brace and recommending follow-up with sports medicine for further evaluation and imaging if symptoms persisting.  Ice and elevate in the interim.  Anti-inflammatories. Discussed strict return precautions. Patient verbalized understanding and  is agreeable with plan.   Final Clinical Impressions(s) / UC Diagnoses   Final diagnoses:  Acute pain of left knee     Discharge Instructions     X-ray normal, follow-up with sports medicine for further evaluation and imaging of knee Wear knee brace for extra support if needed Ice and elevate Anti-inflammatories for pain and swelling Follow-up for any concerns   ED Prescriptions    None     PDMP not reviewed this encounter.   Lew Dawes, PA-C 03/21/20 1054

## 2020-03-22 ENCOUNTER — Ambulatory Visit (INDEPENDENT_AMBULATORY_CARE_PROVIDER_SITE_OTHER): Payer: Managed Care, Other (non HMO) | Admitting: Family Medicine

## 2020-03-22 ENCOUNTER — Other Ambulatory Visit: Payer: Self-pay

## 2020-03-22 ENCOUNTER — Encounter: Payer: Self-pay | Admitting: Family Medicine

## 2020-03-22 VITALS — BP 114/82 | Ht 71.0 in | Wt 215.0 lb

## 2020-03-22 DIAGNOSIS — M25562 Pain in left knee: Secondary | ICD-10-CM | POA: Diagnosis not present

## 2020-03-22 DIAGNOSIS — M25569 Pain in unspecified knee: Secondary | ICD-10-CM | POA: Insufficient documentation

## 2020-03-22 NOTE — Patient Instructions (Signed)
Thank you for coming in to see Annette Blevins today! Please see below to review our plan for today's visit:   1.   Please rest from aggravating activities, ice your knee, and you may use topical Voltaren (diclofenac) gel which you can get over-the-counter for pain. 2.   Please do the knee exercises that were shown to you today every day. 3.   With plan to follow-up in 2 weeks to reevaluate, if you are still having significant pain at that time we may consider getting more imaging.   Please call the clinic at [phone] if your symptoms worsen or you have any concerns. It was our pleasure to serve you.       Dr. Guy Sandifer Dr. Denny Levy [facility]

## 2020-03-22 NOTE — Progress Notes (Signed)
[facility]: Attending Note: I have reviewed the chart, discussed wit the Sports Medicine Fellow. I agree with assessment and treatment plan as detailed in the Fellow's note. Acute to subacute left knee pain. Unclear whether or not this was an injury. See HPI for details. I suspect this is contusion to the medial patellar facet area. She is extremely tender on very mild patellar grind testing and this reproduces her pain exactly. We did review her x-rays that were obtained at outside urgent care. The quadricep tendon appears to be slightly abnormal on that film although there was nothing indicated on the formal reading. This could be just an overlay of shadows. Ultrasound today showed the patellar tendon and n the quadricep tendon to be intact without any defect or tear. There was no significant effusion noted in the knee and the meniscal exam was normal. Assessment: knee pain secondary to patellar contusion Plan:Continue bracing as needed. Quad strengthening exercises. Icing daily. Follow-up 2 weeks. I suspect this will self resolve in 2 to 6 weeks.

## 2020-03-22 NOTE — Progress Notes (Signed)
   PCP: Patient, No Pcp Per  Subjective:   HPI: Patient is a 24 y.o. female here for evaluation of left knee pain. She reports that she started having pain the day after Halloween. She cannot recall much about what she did on Halloween, however she did do a lot of walking. She did not have any specific trauma or injury or fall that she is aware of. She reports that the pain is behind her patella, sometimes more painful in the medial aspect of her knee. She has noticed that the pain is worse with deep squatting and sometimes she feels like her knee is catching or getting stuck when she is in a deep squat.  She denies any numbness or tingling, weakness, or feelings of instability.   Review of Systems:  Per HPI.   PMFSH, medications and smoking status reviewed.      Objective:  Physical Exam:  [facility] 03/22/2020  Frequency of aerobic exercise (# of days/week) 0  Average time in minutes 0  Frequency of strengthening activities (# of days/week) 0     Gen: awake, alert, NAD, comfortable in exam room Pulm: breathing unlabored  Left knee  Inspection: Bilateral knees without evidence of erythema, ecchymosis, swelling, edema. No effusion present  Active ROM: Intact. 0-160d.  Strength: 5/5 strength to resisted flexion/extension without pain  Patella: Mildly positive patellar grind.  She does have some medial patellar facet tenderness. No apprehension. No proximal or distal patellar tendon tenderness to palpation. No quad tendon tenderness to palpation.  Tibia: No tibial plateau, tibial tuberosity tenderness.  Joint line: She does have tenderness along the medial joint line.  No lateral joint line tenderness. Popliteal: No popliteal tenderness to the insertional gastroc. No insertional biceps femoris, semimembranosis, semitendinosis tenderness.  McMurrays/Thessaly's: Mildly positive with reproduction of pain in the medial aspect of her knee Lachmans: Stable  bilaterally with firm endpoint  Anterior/Posterior drawer: Stable bilaterally Varus/valgus stress at 0, 15d: Positive for pain medially with valgus testing, negative for laxity.  No pain or laxity with varus testing.    Limited ultrasound of left knee: -Quadriceps tendon: Well visualized inserting on patella.  She does have an area of hyper echogenicity in the mid substance of the tendon, otherwise no abnormalities. -Suprapatellar pouch: Visualized and without any significant effusion. -Patella: No obvious fractures or abnormalities, no prepatellar bursal enlargement -Patellar tendon: Well visualized and without abnormalities -Medial joint line: Medial meniscus visualized, no abnormality seen.  MCL identified and without any significant tears or abnormalities. -Lateral joint line: Lateral meniscus identified with abnormalities, LCL identified and without abnormalities.  Impression: -No significant abnormalities identified.   Assessment & Plan:  1.  Left medial knee pain Patient with pain in the medial aspect of her knee, she does have some findings consistent with medial patellar bone contusion with tenderness and pain in that area.  Differential also includes medial meniscus tear, MCL sprain, patellofemoral pain syndrome.  She does not recall a fall or injury however she does not remember much about what happened that night per her report.  Plan: -Rest from aggravating activities, ice, topical Voltaren -Quadriceps strengthening exercises -Follow-up in 2 weeks, if persistent pain would consider MRI to rule out meniscal tear.  Guy Sandifer, MD [facility] 03/22/2020 8:56 AM

## 2020-04-03 ENCOUNTER — Ambulatory Visit: Payer: Managed Care, Other (non HMO) | Admitting: Family Medicine

## 2020-05-15 ENCOUNTER — Encounter (HOSPITAL_COMMUNITY): Payer: Self-pay | Admitting: Emergency Medicine

## 2020-05-15 ENCOUNTER — Other Ambulatory Visit: Payer: Self-pay

## 2020-05-15 ENCOUNTER — Emergency Department (HOSPITAL_COMMUNITY)
Admission: EM | Admit: 2020-05-15 | Discharge: 2020-05-16 | Disposition: A | Discharge status: Home / Self Care | Payer: Managed Care, Other (non HMO) | Attending: Emergency Medicine | Admitting: Emergency Medicine

## 2020-05-15 DIAGNOSIS — Z87891 Personal history of nicotine dependence: Secondary | ICD-10-CM | POA: Diagnosis not present

## 2020-05-15 DIAGNOSIS — K659 Peritonitis, unspecified: Secondary | ICD-10-CM | POA: Diagnosis not present

## 2020-05-15 DIAGNOSIS — Z91040 Latex allergy status: Secondary | ICD-10-CM | POA: Insufficient documentation

## 2020-05-15 DIAGNOSIS — K6389 Other specified diseases of intestine: Secondary | ICD-10-CM

## 2020-05-15 DIAGNOSIS — R109 Unspecified abdominal pain: Secondary | ICD-10-CM | POA: Diagnosis present

## 2020-05-15 LAB — CBC
HCT: 44.3 % (ref 36.0–46.0)
Hemoglobin: 14.2 g/dL (ref 12.0–15.0)
MCH: 28 pg (ref 26.0–34.0)
MCHC: 32.1 g/dL (ref 30.0–36.0)
MCV: 87.4 fL (ref 80.0–100.0)
Platelets: 382 10*3/uL (ref 150–400)
RBC: 5.07 MIL/uL (ref 3.87–5.11)
RDW: 12.4 % (ref 11.5–15.5)
WBC: 9 10*3/uL (ref 4.0–10.5)
nRBC: 0 % (ref 0.0–0.2)

## 2020-05-15 LAB — URINALYSIS, ROUTINE W REFLEX MICROSCOPIC
Bacteria, UA: NONE SEEN
Bilirubin Urine: NEGATIVE
Glucose, UA: NEGATIVE mg/dL
Ketones, ur: 5 mg/dL — AB
Nitrite: NEGATIVE
Protein, ur: NEGATIVE mg/dL
Specific Gravity, Urine: 1.017 (ref 1.005–1.030)
pH: 5 (ref 5.0–8.0)

## 2020-05-15 LAB — LIPASE, BLOOD: Lipase: 26 U/L (ref 11–51)

## 2020-05-15 LAB — COMPREHENSIVE METABOLIC PANEL
ALT: 26 U/L (ref 0–44)
AST: 27 U/L (ref 15–41)
Albumin: 3.6 g/dL (ref 3.5–5.0)
Alkaline Phosphatase: 83 U/L (ref 38–126)
Anion gap: 13 (ref 5–15)
BUN: 12 mg/dL (ref 6–20)
CO2: 22 mmol/L (ref 22–32)
Calcium: 9.7 mg/dL (ref 8.9–10.3)
Chloride: 102 mmol/L (ref 98–111)
Creatinine, Ser: 0.8 mg/dL (ref 0.44–1.00)
GFR, Estimated: >60 mL/min (ref >60)
Glucose, Bld: 99 mg/dL (ref 70–99)
Potassium: 3.9 mmol/L (ref 3.5–5.1)
Sodium: 137 mmol/L (ref 135–145)
Total Bilirubin: 0.3 mg/dL (ref 0.3–1.2)
Total Protein: 7.6 g/dL (ref 6.5–8.1)

## 2020-05-15 LAB — I-STAT BETA HCG BLOOD, ED (MC, WL, AP ONLY): I-stat hCG, quantitative: <5 m[IU]/mL (ref <5)

## 2020-05-15 NOTE — ED Triage Notes (Signed)
Pt arrives via POV from home with left lower abdominal for the last 3 days. Pt reports pain became worse around noon today. Pt denies recent/nausea/vomiting. Pt reports several soft stools today. Denies recent fever/ sick contacts.

## 2020-05-16 ENCOUNTER — Emergency Department (HOSPITAL_COMMUNITY): Payer: Managed Care, Other (non HMO)

## 2020-05-16 MED ORDER — IOHEXOL 300 MG/ML  SOLN
100.0000 mL | Freq: Once | INTRAMUSCULAR | Status: AC | PRN
  Administered 2020-05-16: 100 mL via INTRAVENOUS

## 2020-05-16 MED ORDER — ONDANSETRON 4 MG PO TBDP: 4.0000 mg | ORAL_TABLET | Freq: Three times a day (TID) | ORAL | 0 refills | Status: DC | PRN

## 2020-05-16 MED ORDER — KETOROLAC TROMETHAMINE 30 MG/ML IJ SOLN
30.0000 mg | Freq: Once | INTRAMUSCULAR | Status: AC
  Administered 2020-05-16: 30 mg via INTRAVENOUS
  Filled 2020-05-16: qty 1

## 2020-05-16 MED ORDER — SODIUM CHLORIDE 0.9 % IV BOLUS
1000.0000 mL | Freq: Once | INTRAVENOUS | Status: AC
  Administered 2020-05-16: 1000 mL via INTRAVENOUS

## 2020-05-16 MED ORDER — IBUPROFEN 600 MG PO TABS: 600.0000 mg | ORAL_TABLET | Freq: Four times a day (QID) | ORAL | 0 refills | Status: DC | PRN

## 2020-05-16 MED ORDER — ONDANSETRON HCL 4 MG/2ML IJ SOLN
4.0000 mg | Freq: Once | INTRAMUSCULAR | Status: AC
  Administered 2020-05-16: 4 mg via INTRAVENOUS
  Filled 2020-05-16: qty 2

## 2020-05-16 NOTE — ED Provider Notes (Signed)
[facility] EMERGENCY DEPARTMENT Provider Note   CSN: [id number] Arrival date & time: 05/15/20  1626     History Chief Complaint  Patient presents with  . Abdominal Pain    Annette Blevins is a 24 y.o. female.  HPI      24 year old female with a history of anxiety, depression, SVT, ovarian cyst, presents with concern for abdominal pain.  Reports that she has had left-sided abdominal pain over the last 3 days.  Reports that it started as a dull pain, however has worsened significantly in the last day.  Reports that she was doubled over with pain, and when she leaned over she developed nausea and had some vomiting.  Denies any dysuria, concerning vaginal discharge or vaginal bleeding.  Reports that she just had her menses, described normal vaginal bleeding and discharge.  Reports she had some loose stool yesterday.  Denies fevers, cough, chest pain or shortness of breath.  The pain is severe, worse with movements.  Past Medical History:  Diagnosis Date  . Anxiety   . Depression   . Irregular menses   . Ovarian cyst   . Panic attack as reaction to stress   . SVT (supraventricular tachycardia) [facility])     Patient Active Problem List   Diagnosis Date Noted  . Knee pain 03/22/2020  . LLQ pain 03/03/2017  . Blood type O- 12/14/2016    Past Surgical History:  Procedure Laterality Date  . NO PAST SURGERIES       OB History    Gravida  0   Para  0   Term  0   Preterm  0   AB  0   Living  0     SAB  0   IAB  0   Ectopic  0   Multiple  0   Live Births  0           Family History  Problem Relation Age of Onset  . Hypertension Father   . Hypercholesterolemia Father   . Healthy Mother     Social History   Tobacco Use  . Smoking status: Former Smoker    Quit date: 2019    Years since quitting: 2.9  . Smokeless tobacco: Never Used  Vaping Use  . Vaping Use: Never used  Substance Use Topics  . Alcohol use: Yes    Comment: rarely   . Drug use: Yes    Types: Marijuana    Home Medications Prior to Admission medications   Medication Sig Start Date End Date Taking? Authorizing Provider  ibuprofen (ADVIL) 600 MG tablet Take 1 tablet (600 mg total) by mouth every 6 (six) hours as needed. 05/16/20  Yes Alvira Monday, MD  ondansetron (ZOFRAN ODT) 4 MG disintegrating tablet Take 1 tablet (4 mg total) by mouth every 8 (eight) hours as needed for nausea or vomiting. 05/16/20  Yes Alvira Monday, MD  drospirenone-ethinyl estradiol (YAZ) 3-0.02 MG tablet Take 1 tablet by mouth daily. 02/07/20   Copland, Ilona Sorrel, PA-C  medroxyPROGESTERone (PROVERA) 10 MG tablet Take 1 tablet (10 mg total) by mouth daily for 7 days. 02/01/20 02/08/20  Copland, Ilona Sorrel, PA-C  sertraline (ZOLOFT) 50 MG tablet Take 50 mg by mouth daily.    [provider]  levonorgestrel-ethinyl estradiol (AVIANE) 0.1-20 MG-MCG tablet Take 1 tablet by mouth daily. 05/29/19 11/21/19  Copland, Ilona Sorrel, PA-C    Allergies    Latex and Other  Review of Systems  Review of Systems  Constitutional: Negative for fever.  HENT: Negative for sore throat.   Eyes: Negative for visual disturbance.  Respiratory: Negative for cough and shortness of breath.   Cardiovascular: Negative for chest pain.  Gastrointestinal: Positive for abdominal pain, nausea and vomiting.  Genitourinary: Negative for difficulty urinating and dysuria.  Musculoskeletal: Negative for back pain and neck pain.  Skin: Negative for rash.  Neurological: Negative for syncope and headaches.    Physical Exam Updated Vital Signs BP 122/75 (BP Location: Right Arm)   Pulse 94   Temp 98.3 F (36.8 C) (Oral)   Resp 17   LMP 05/10/2020   SpO2 99%   Physical Exam Vitals and nursing note reviewed.  Constitutional:      General: She is not in acute distress.    Appearance: She is well-developed and well-nourished. She is not diaphoretic.  HENT:     Head: Normocephalic and atraumatic.  Eyes:      Extraocular Movements: EOM normal.     Conjunctiva/sclera: Conjunctivae normal.  Cardiovascular:     Rate and Rhythm: Normal rate and regular rhythm.     Pulses: Intact distal pulses.     Heart sounds: Normal heart sounds. No murmur heard. No friction rub. No gallop.   Pulmonary:     Effort: Pulmonary effort is normal. No respiratory distress.     Breath sounds: Normal breath sounds. No wheezing or rales.  Abdominal:     General: There is no distension.     Palpations: Abdomen is soft.     Tenderness: There is abdominal tenderness in the left upper quadrant and left lower quadrant. There is left CVA tenderness. There is no guarding.  Musculoskeletal:        General: No tenderness or edema.     Cervical back: Normal range of motion.  Skin:    General: Skin is warm and dry.     Findings: No erythema or rash.  Neurological:     Mental Status: She is alert and oriented to person, place, and time.     ED Results / Procedures / Treatments   Labs (all labs ordered are listed, but only abnormal results are displayed) Labs Reviewed  URINALYSIS, ROUTINE W REFLEX MICROSCOPIC - Abnormal; Notable for the following components:      Result Value   APPearance HAZY (*)    Hgb urine dipstick MODERATE (*)    Ketones, ur 5 (*)    Leukocytes,Ua TRACE (*)    All other components within normal limits  LIPASE, BLOOD  COMPREHENSIVE METABOLIC PANEL  CBC  I-STAT BETA HCG BLOOD, ED ([facility], [facility], [facility] ONLY)    EKG None  Radiology CT ABDOMEN PELVIS W CONTRAST  Result Date: 05/16/2020 CLINICAL DATA:  24 year old female with history of left lower abdominal pain for the past 3 days. Clinical suspicion of acute diverticulitis. EXAM: CT ABDOMEN AND PELVIS WITH CONTRAST TECHNIQUE: Multidetector CT imaging of the abdomen and pelvis was performed using the standard protocol following bolus administration of intravenous contrast. CONTRAST:  OMNIPAQUE IOHEXOL 300 MG/ML  SOLN COMPARISON:  No priors.  FINDINGS: Lower chest: Unremarkable. Hepatobiliary: No suspicious cystic or solid hepatic lesions. No intra or extrahepatic biliary ductal dilatation. Gallbladder is normal in appearance. Pancreas: No pancreatic mass. No pancreatic ductal dilatation. No pancreatic or peripancreatic fluid collections or inflammatory changes. Spleen: Unremarkable. Adrenals/Urinary Tract: The subcentimeter low-attenuation lesions in both kidneys, too small to definitively characterize, but favored to represent tiny cysts. No definite suspicious renal lesions.  No hydroureteronephrosis. Urinary bladder is normal in appearance. Stomach/Bowel: The appearance of the stomach is normal. There is no pathologic dilatation of small bowel or colon. No colonic diverticulosis identified. There are inflammatory changes adjacent to the descending colon (axial image 45 of series 3) where there appears to be a focal area of fat necrosis, most compatible with epiploic appendagitis. Normal appendix. Vascular/Lymphatic: No significant atherosclerotic disease, aneurysm or dissection noted in the abdominal or pelvic vasculature. No lymphadenopathy noted in the abdomen or pelvis. Reproductive: Uterus and ovaries are unremarkable in appearance. Other: No significant volume of ascites.  No pneumoperitoneum. Musculoskeletal: There are no aggressive appearing lytic or blastic lesions noted in the visualized portions of the skeleton. IMPRESSION: 1. Small focus of fat necrosis with surrounding inflammatory changes noted adjacent to the descending colon, most compatible with an area of epiploic appendagitis. 2. No colonic diverticulosis or evidence of acute diverticulitis. 3. Normal appendix. Electronically Signed   By: Trudie Reed M.D.   On: 05/16/2020 13:51    Procedures Procedures (including critical care time)  Medications Ordered in ED Medications  ondansetron (ZOFRAN) injection 4 mg (4 mg Intravenous Given 05/16/20 1311)  sodium chloride 0.9 %  bolus 1,000 mL (1,000 mLs Intravenous New Bag/Given 05/16/20 1312)  ketorolac (TORADOL) 30 MG/ML injection 30 mg (30 mg Intravenous Given 05/16/20 1352)  iohexol (OMNIPAQUE) 300 MG/ML solution 100 mL (100 mLs Intravenous Contrast Given 05/16/20 1341)    ED Course  I have reviewed the triage vital signs and the nursing notes.  Pertinent labs & imaging results that were available during my care of the patient were reviewed by me and considered in my medical decision making (see chart for details).    MDM Rules/Calculators/A&P                          23 year old female with a history of anxiety, depression, SVT, ovarian cyst, presents with concern for abdominal pain.  DDx includes appendicitis, pancreatitis, cholecystitis, pyelonephritis, nephrolithiasis, diverticulitis, PID, ovarian torsion, ectopic pregnancy, and tuboovarian abscess   Pregnancy test negative.  Labs show no sign of pancreatitis, hepatitis, or urinary tract infection.    Given left lower quadrant tenderness on exam, CT abdomen pelvis was done to evaluate for signs of diverticulitis, and returned showing signs of epiploic appendagitis.  No sign of other pelvic abnormalities on CT scan, and given presence of epiploic appendagitis, have low suspicion for ovarian torsion, PID or TOA.  Recommend supportive care for epiploic appendagitis, including NSAIDs and Zofran.  Patient discharged in stable condition with understanding of reasons to return.   Final Clinical Impression(s) / ED Diagnoses Final diagnoses:  Epiploic appendagitis    Rx / DC Orders ED Discharge Orders         Ordered    ibuprofen (ADVIL) 600 MG tablet  Every 6 hours PRN        05/16/20 1408    ondansetron (ZOFRAN ODT) 4 MG disintegrating tablet  Every 8 hours PRN        05/16/20 1408           Alvira Monday, MD 05/16/20 1413

## 2020-05-16 NOTE — Discharge Instructions (Signed)
Take Tylenol 1000 mg 4 times a day for 1 week. This is the maximum dose of Tylenol usually take from all sources. Please check other over-the-counter medications and prescriptions to ensure you are not taking other medications that contain acetaminophen.  You may take the ibuprofen you have been prescribed at the same time or alternating times to the tylenol.  Make sure to take it with food.

## 2021-03-28 IMAGING — CT CT ABD-PELV W/ CM
2 of 4 series · 16 of 46 positions shown, 18 images · IV contrast (omnipaque)
Comparison: No priors.

CLINICAL DATA: 24-year-old female with history of left lower
abdominal pain for the past 3 days. Clinical suspicion of acute
diverticulitis.

EXAM:
CT ABDOMEN AND PELVIS WITH CONTRAST
TECHNIQUE: Multidetector CT imaging of the abdomen and pelvis was performed
using the standard protocol following bolus administration of
intravenous contrast.
CONTRAST:  100mL OMNIPAQUE IOHEXOL 300 MG/ML  SOLN

[Series 3: abd/ pelvis 5.0 i30f 2 · axial · 0.98mm/px · z∈[+658,+1143]mm · 13 of 107 slices shown, 15 images]
[im 5/107  soft-tissue]
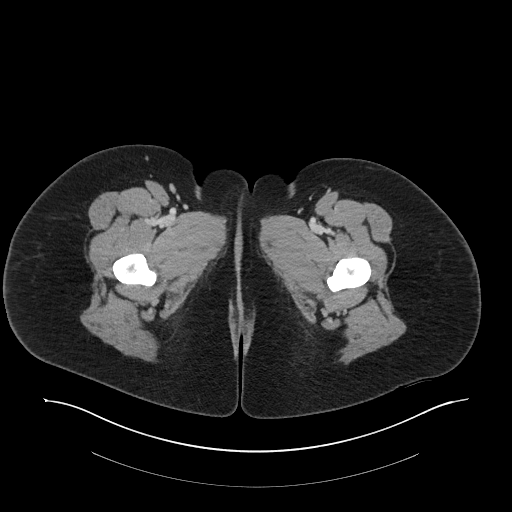
[im 5/107  bone]
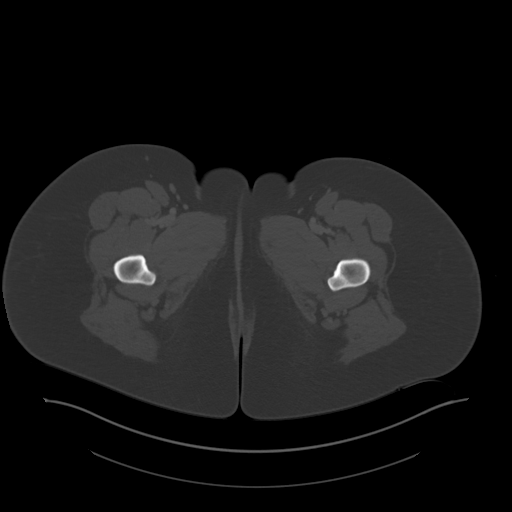
[im 14/107  soft-tissue]
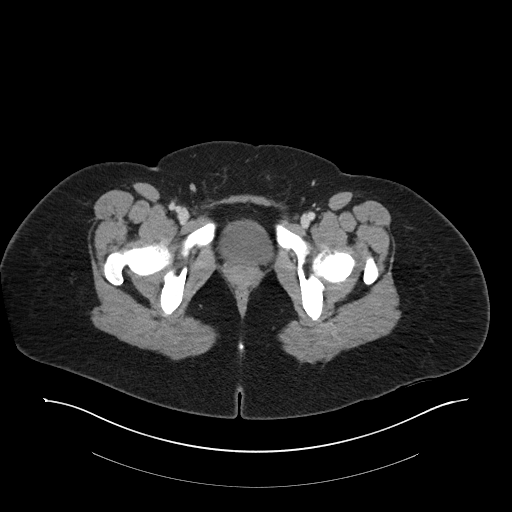
[im 24/107  soft-tissue]
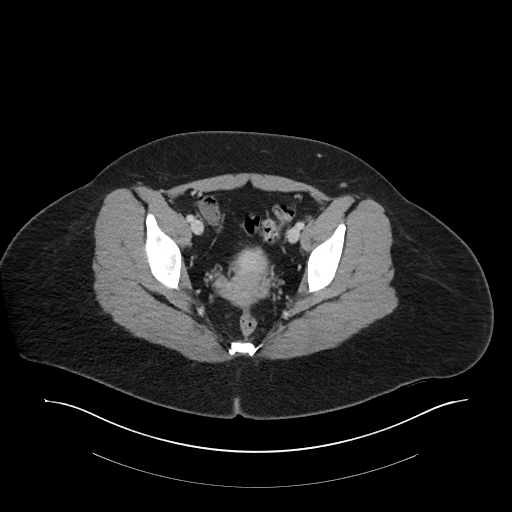
[im 28/107  soft-tissue]
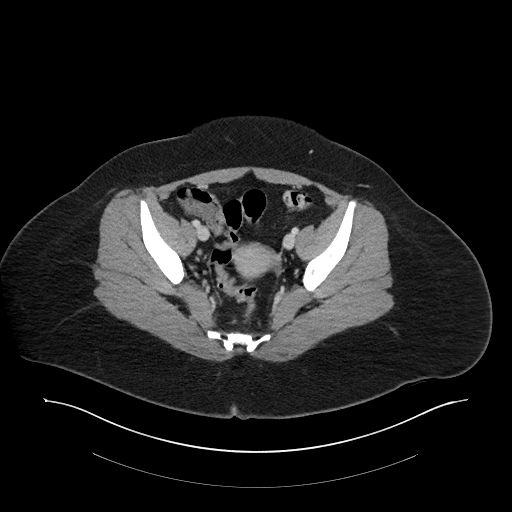
[im 37/107  soft-tissue]
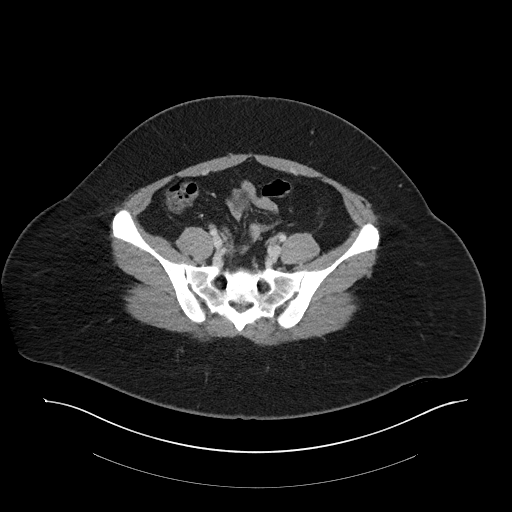
[im 47/107  soft-tissue]
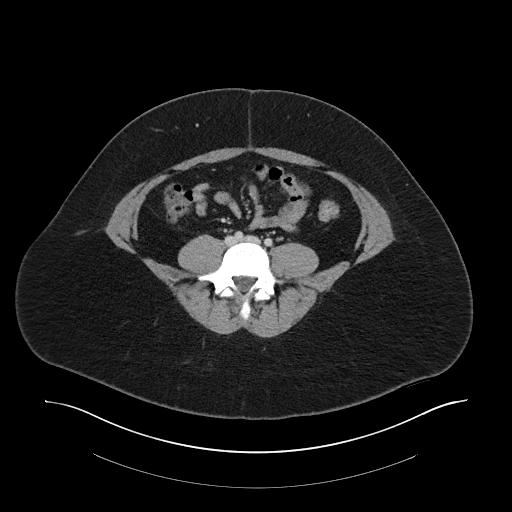
[im 56/107  soft-tissue]
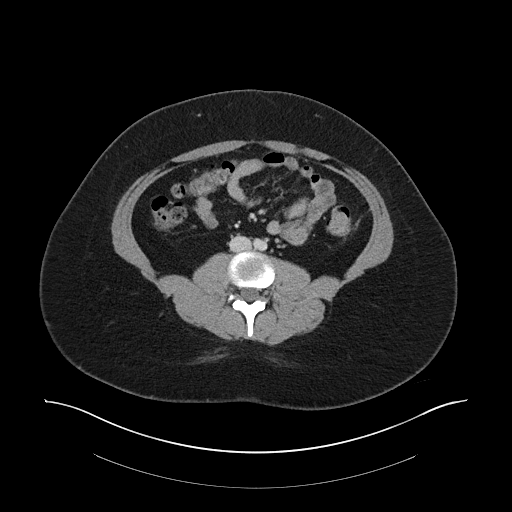
[im 60/107  soft-tissue]
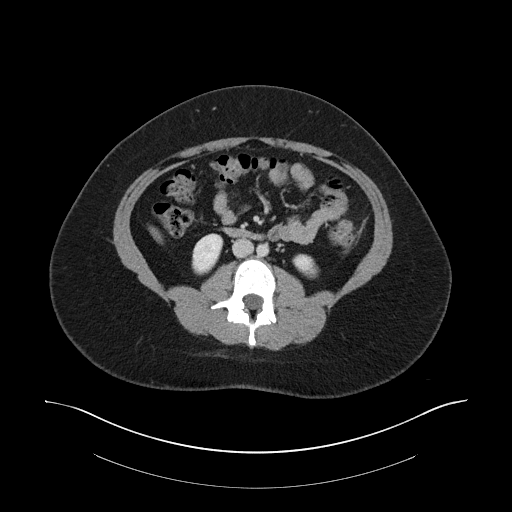
[im 70/107  soft-tissue]
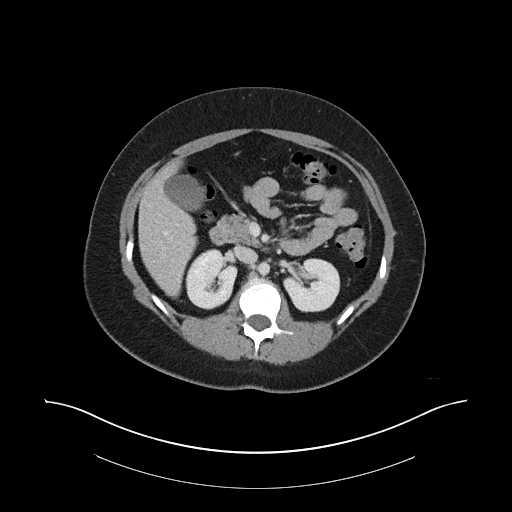
[im 70/107  bone]
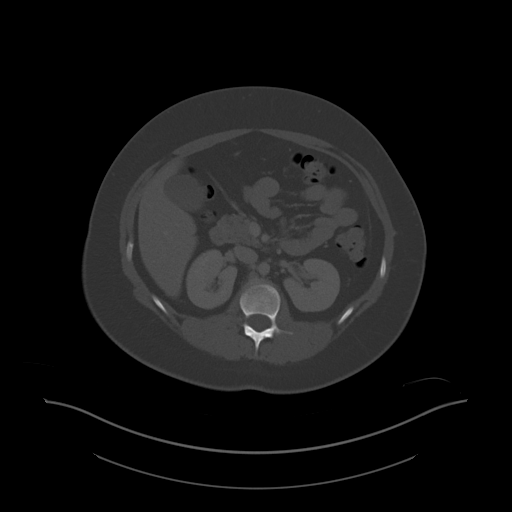
[im 79/107  soft-tissue]
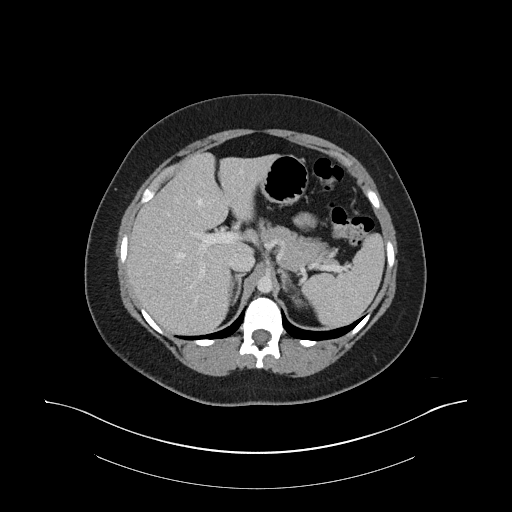
[im 83/107  soft-tissue]
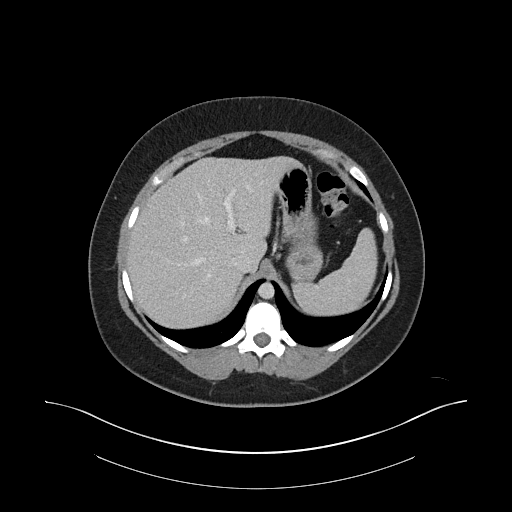
[im 93/107  soft-tissue]
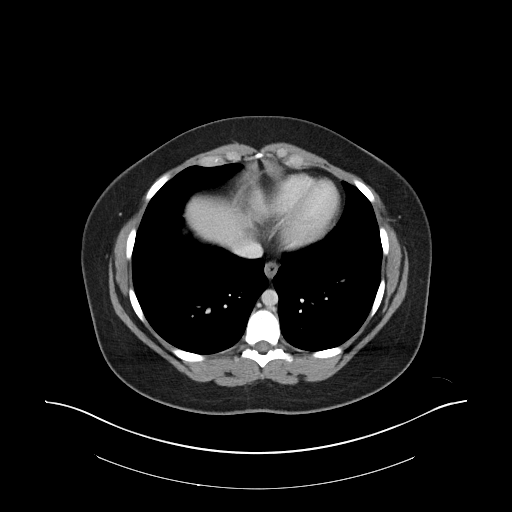
[im 102/107  soft-tissue]
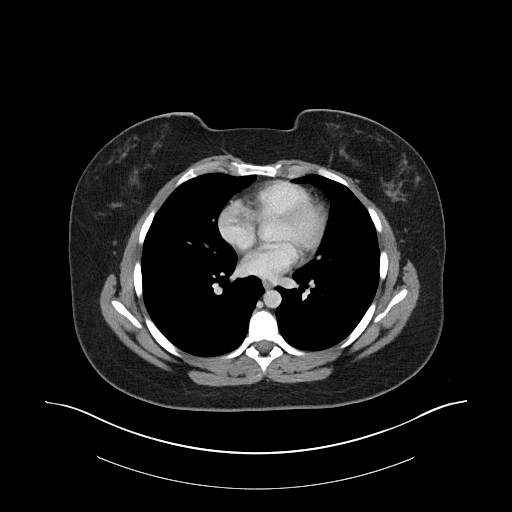

[Series 6: coronal soft tissue · coronal · 0.96mm/px · 3 of 109 slices shown]
[im 37/109  soft-tissue]
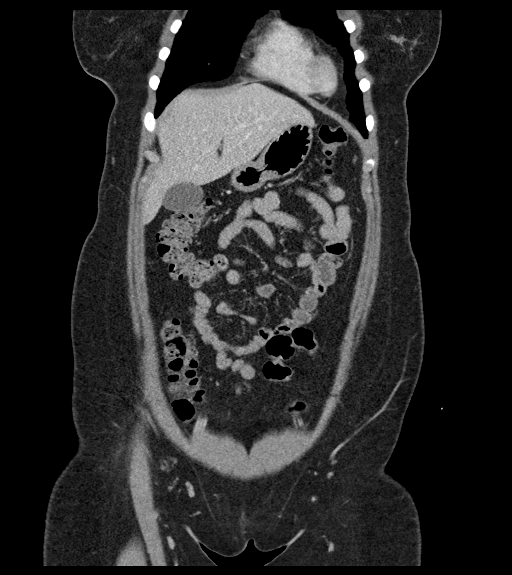
[im 49/109  soft-tissue]
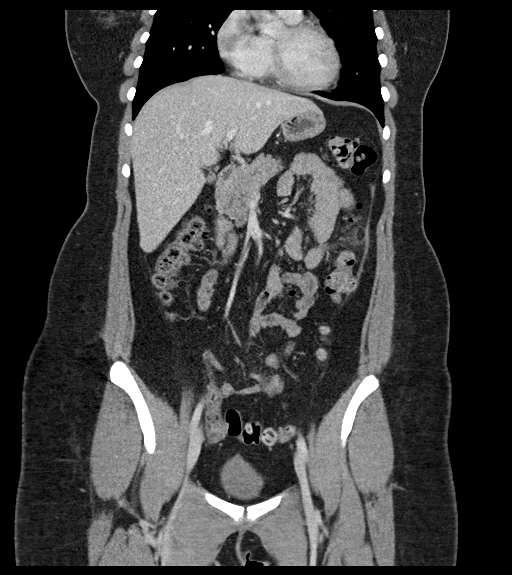
[im 61/109  soft-tissue]
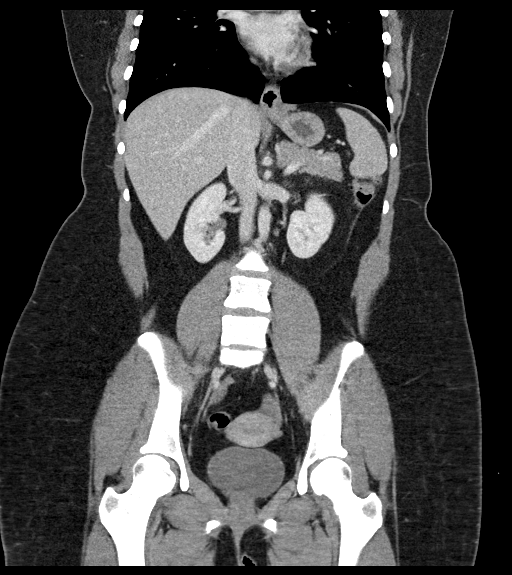

[16 of 46 positions shown; findings below may reference images not displayed]

FINDINGS: Lower chest: Unremarkable.

Hepatobiliary: No suspicious cystic or solid hepatic lesions. No
intra or extrahepatic biliary ductal dilatation. Gallbladder is
normal in appearance.

Pancreas: No pancreatic mass. No pancreatic ductal dilatation. No
pancreatic or peripancreatic fluid collections or inflammatory
changes.

Spleen: Unremarkable.

Adrenals/Urinary Tract: The subcentimeter low-attenuation lesions in
both kidneys, too small to definitively characterize, but favored to
represent tiny cysts. No definite suspicious renal lesions. No
hydroureteronephrosis. Urinary bladder is normal in appearance.

Stomach/Bowel: The appearance of the stomach is normal. There is no
pathologic dilatation of small bowel or colon. No colonic
diverticulosis identified. There are inflammatory changes adjacent
to the descending colon (axial image 45 of series 3) where there
appears to be a focal area of fat necrosis, most compatible with
epiploic appendagitis. Normal appendix.

Vascular/Lymphatic: No significant atherosclerotic disease, aneurysm
or dissection noted in the abdominal or pelvic vasculature. No
lymphadenopathy noted in the abdomen or pelvis.

Reproductive: Uterus and ovaries are unremarkable in appearance.

Other: No significant volume of ascites.  No pneumoperitoneum.

Musculoskeletal: There are no aggressive appearing lytic or blastic
lesions noted in the visualized portions of the skeleton.
IMPRESSION: 1. Small focus of fat necrosis with surrounding inflammatory changes
noted adjacent to the descending colon, most compatible with an area
of epiploic appendagitis.
2. No colonic diverticulosis or evidence of acute diverticulitis.
3. Normal appendix.

## 2021-06-04 ENCOUNTER — Ambulatory Visit (INDEPENDENT_AMBULATORY_CARE_PROVIDER_SITE_OTHER): Payer: Managed Care, Other (non HMO) | Admitting: Family Medicine

## 2021-06-04 ENCOUNTER — Ambulatory Visit
Admission: RE | Admit: 2021-06-04 | Discharge: 2021-06-04 | Disposition: A | Discharge status: Home / Self Care | Payer: Managed Care, Other (non HMO) | Source: Ambulatory Visit | Attending: Family Medicine | Admitting: Family Medicine

## 2021-06-04 ENCOUNTER — Encounter: Payer: Self-pay | Admitting: Family Medicine

## 2021-06-04 VITALS — BP 134/92 | Ht 69.0 in | Wt 238.0 lb

## 2021-06-04 DIAGNOSIS — M25561 Pain in right knee: Secondary | ICD-10-CM | POA: Diagnosis not present

## 2021-06-04 NOTE — Progress Notes (Signed)
PCP: Silas Flood, MD  Subjective:   HPI: Patient is a 26 y.o. female here for right knee injury.  Patient reports on New Years she was stepping over something when her shoe caught on it causing her to fall forward directly onto right knee. Swelling and bruising - initial difficulty walking. Bruising has improved. Pain has improved also and able to work out but difficulty kneeling and pressing on the knee. No locking, giving out.  Past Medical History:  Diagnosis Date   Anxiety    Depression    Irregular menses    Ovarian cyst    Panic attack as reaction to stress    SVT (supraventricular tachycardia) (HCC)     Current Outpatient Medications on File Prior to Visit  Medication Sig Dispense Refill   drospirenone-ethinyl estradiol (YAZ) 3-0.02 MG tablet Take 1 tablet by mouth daily. 84 tablet 1   ibuprofen (ADVIL) 600 MG tablet Take 1 tablet (600 mg total) by mouth every 6 (six) hours as needed. 30 tablet 0   medroxyPROGESTERone (PROVERA) 10 MG tablet Take 1 tablet (10 mg total) by mouth daily for 7 days. 7 tablet 0   ondansetron (ZOFRAN ODT) 4 MG disintegrating tablet Take 1 tablet (4 mg total) by mouth every 8 (eight) hours as needed for nausea or vomiting. 20 tablet 0   sertraline (ZOLOFT) 50 MG tablet Take 50 mg by mouth daily.     [DISCONTINUED] levonorgestrel-ethinyl estradiol (AVIANE) 0.1-20 MG-MCG tablet Take 1 tablet by mouth daily. 84 tablet 3   No current facility-administered medications on file prior to visit.    Past Surgical History:  Procedure Laterality Date   NO PAST SURGERIES      Allergies  Allergen Reactions   Latex Hives   Other Hives    BP (!) 134/92    Ht 5\' 9"  (1.753 m)    Wt 238 lb (108 kg)    BMI 35.15 kg/m   [facility] 03/22/2020 06/04/2021  Frequency of aerobic exercise (# of days/week) 0 5  Average time in minutes 0 25  Frequency of strengthening activities (# of days/week) 0 5    No flowsheet data  found.      Objective:  Physical Exam:  Gen: NAD, comfortable in exam room  Right knee: Small bruise anterior knee.  No other gross deformity, effusion. TTP over patella, proximal tibia, medial and lateral joint lines. FROM with normal strength. Negative ant/post drawers. Negative valgus/varus testing. Negative lachman. Negative mcmurrays, apleys, thessalys NV intact distally.   Assessment & Plan:  1. Right knee injury - likely due to contusion but will obtain radiographs to ensure no fracture with her hard fall onto this knee.  Icing, ibuprofen or tylenol, quad strengthening if x-rays are normal.  F/u in 1 month if x-rays negative for fracture.

## 2021-06-04 NOTE — Patient Instructions (Signed)
Get x-rays after you leave today. Your exam is reassuring that you didn't tear ligaments in your knee or meniscus. We need to rule out a small fracture especially of your kneecap because this would change our management. We will call you with the results and next steps. If the x-rays look good, no restrictions on working out. Quad strengthening daily. Icing 15 minutes at a time if needed. Ibuprofen or tylenol if needed. Follow up with me in 1 month but call me sooner if you're struggling.

## 2022-01-13 DIAGNOSIS — Z0289 Encounter for other administrative examinations: Secondary | ICD-10-CM

## 2022-01-20 ENCOUNTER — Encounter (INDEPENDENT_AMBULATORY_CARE_PROVIDER_SITE_OTHER): Payer: Self-pay | Admitting: Family Medicine

## 2022-01-20 ENCOUNTER — Ambulatory Visit (INDEPENDENT_AMBULATORY_CARE_PROVIDER_SITE_OTHER): Payer: Managed Care, Other (non HMO) | Admitting: Family Medicine

## 2022-01-20 VITALS — BP 107/74 | HR 110 | Temp 98.0°F | Ht 68.0 in | Wt 227.0 lb

## 2022-01-20 DIAGNOSIS — E282 Polycystic ovarian syndrome: Secondary | ICD-10-CM

## 2022-01-20 DIAGNOSIS — E669 Obesity, unspecified: Secondary | ICD-10-CM

## 2022-01-20 DIAGNOSIS — F32A Depression, unspecified: Secondary | ICD-10-CM | POA: Insufficient documentation

## 2022-01-20 DIAGNOSIS — R0602 Shortness of breath: Secondary | ICD-10-CM | POA: Diagnosis not present

## 2022-01-20 DIAGNOSIS — Z6834 Body mass index (BMI) 34.0-34.9, adult: Secondary | ICD-10-CM

## 2022-01-20 DIAGNOSIS — F419 Anxiety disorder, unspecified: Secondary | ICD-10-CM

## 2022-01-20 DIAGNOSIS — R000 Tachycardia, unspecified: Secondary | ICD-10-CM | POA: Diagnosis not present

## 2022-01-20 DIAGNOSIS — R5383 Other fatigue: Secondary | ICD-10-CM

## 2022-01-20 DIAGNOSIS — R7303 Prediabetes: Secondary | ICD-10-CM

## 2022-01-21 LAB — VITAMIN D 25 HYDROXY (VIT D DEFICIENCY, FRACTURES): Vit D, 25-Hydroxy: 19 ng/mL — ABNORMAL LOW (ref 30.0–100.0)

## 2022-01-21 LAB — VITAMIN B12: Vitamin B-12: 343 pg/mL (ref 232–1245)

## 2022-01-21 LAB — FOLATE: Folate: 9.1 ng/mL (ref >3.0)

## 2022-01-21 LAB — T4, FREE: Free T4: 1.28 ng/dL (ref 0.82–1.77)

## 2022-01-21 LAB — TSH: TSH: 2.2 u[IU]/mL (ref 0.450–4.500)

## 2022-01-21 LAB — INSULIN, RANDOM: INSULIN: 21.5 u[IU]/mL (ref 2.6–24.9)

## 2022-01-29 NOTE — Progress Notes (Unsigned)
Chief Complaint:   OBESITY Annette Blevins (MR# [id number]) is a 26 y.o. female who presents for evaluation and treatment of obesity and related comorbidities. Current BMI is Body mass index is 34.52 kg/m. Annette Blevins has been struggling with her weight for many years and has been unsuccessful in either losing weight, maintaining weight loss, or reaching her healthy weight goal.  Annette Blevins started to gain weight at 26 years old with a long commute.  She was working in [employer], and now works from home for [employer].  Typically has only coffee and a piece of cheese midmorning.  Has lunch around 2 and dinner around 1030 when her husband gets off work.  Restaurant meals half of the time.  Felt good at 170 pounds.  Likes veggies.  Likes big portions.  Has 1-2 snacks per day.  Craves sweets.  Has lost 20 pounds in 1 year with dietary changes.  Also taking online college classes.  Has a family history of obesity, with her mom.  Annette Blevins is currently in the action stage of change and ready to dedicate time achieving and maintaining a healthier weight. Annette Blevins is interested in becoming our patient and working on intensive lifestyle modifications including (but not limited to) diet and exercise for weight loss.  [name]'s habits were reviewed today and are as follows: {MWM WT HABITS:23461}.  Depression Screen Annette Blevins's Food and Mood (modified PHQ-9) score was 14.     01/20/2022    8:27 AM  Depression screen PHQ 2/9  Decreased Interest 2  Down, Depressed, Hopeless 3  PHQ - 2 Score 5  Altered sleeping 2  Tired, decreased energy 3  Change in appetite 2  Feeling bad or failure about yourself  1  Trouble concentrating 1  Moving slowly or fidgety/restless 0  Suicidal thoughts 0  PHQ-9 Score 14  Difficult doing work/chores Somewhat difficult   Subjective:   1. Other fatigue Annette Blevins admits to daytime somnolence and admits to waking up still tired. Patient has a history of symptoms of daytime fatigue, morning  fatigue, and morning headache. Annette Blevins generally gets 6 or 9 hours of sleep per night, and states that she has nightime awakenings. Snoring is present. Apneic episodes are not present. Epworth Sleepiness Score is 9.   2. SOBOE (shortness of breath on exertion) Annette Blevins notes increasing shortness of breath with exercising and seems to be worsening over time with weight gain. She notes getting out of breath sooner with activity than she used to. This has not gotten worse recently. Annette Blevins denies shortness of breath at rest or orthopnea.  3. Prediabetes ***  4. PCOS (polycystic ovarian syndrome) ***  5. Sinus tachycardia ***  6. Anxiety and depression ***  Assessment/Plan:   1. Other fatigue Annette Blevins does feel that her weight is causing her energy to be lower than it should be. Fatigue may be related to obesity, depression or many other causes. Labs will be ordered, and in the meanwhile, Jaydy will focus on self care including making healthy food choices, increasing physical activity and focusing on stress reduction.  - EKG 12-Lead - Vitamin B12 - Folate - Insulin, random - T4, free - TSH - VITAMIN D 25 Hydroxy (Vit-D Deficiency, Fractures)  2. SOBOE (shortness of breath on exertion) Annette Blevins does feel that she gets out of breath more easily that she used to when she exercises. Annette Blevins's shortness of breath appears to be obesity related and exercise induced. She has agreed to work on weight loss and  gradually increase exercise to treat her exercise induced shortness of breath. Will continue to monitor closely.  3. Prediabetes *** - Insulin, random  4. PCOS (polycystic ovarian syndrome) *** - Vitamin B12 - Insulin, random  5. Sinus tachycardia ***  6. Anxiety and depression ***  7. Obesity, current BMI 34.5 Annette Blevins is currently in the action stage of change and her goal is to continue with weight loss efforts. I recommend [name] begin the structured treatment plan as follows:  She has  agreed to the Category 4 Plan.  Exercise goals: As is.    Behavioral modification strategies: increasing lean protein intake, decreasing simple carbohydrates, increasing vegetables, increasing water intake, decreasing eating out, no skipping meals, meal planning and cooking strategies, better snacking choices, and decreasing junk food.  She was informed of the importance of frequent follow-up visits to maximize her success with intensive lifestyle modifications for her multiple health conditions. She was informed we would discuss her lab results at her next visit unless there is a critical issue that needs to be addressed sooner. Annette Blevins agreed to keep her next visit at the agreed upon time to discuss these results.  Objective:   Blood pressure 107/74, pulse (!) 110, temperature 98 F (36.7 C), height 5\' 8"  (1.727 m), weight 227 lb (103 kg), last menstrual period 12/29/2021, SpO2 96 %. Body mass index is 34.52 kg/m.  EKG: Normal sinus rhythm, rate 102 BPM.  Indirect Calorimeter completed today shows a VO2 of 319 and a REE of 2203.  Her calculated basal metabolic rate is 2204 thus her basal metabolic rate is better than expected.  General: Cooperative, alert, well developed, in no acute distress. HEENT: Conjunctivae and lids unremarkable. Cardiovascular: Regular rhythm.  Lungs: Normal work of breathing. Neurologic: No focal deficits.   Lab Results  Component Value Date   CREATININE 0.80 05/15/2020   BUN 12 05/15/2020   NA 137 05/15/2020   K 3.9 05/15/2020   CL 102 05/15/2020   CO2 22 05/15/2020   Lab Results  Component Value Date   ALT 26 05/15/2020   AST 27 05/15/2020   ALKPHOS 83 05/15/2020   BILITOT 0.3 05/15/2020   Lab Results  Component Value Date   HGBA1C 5.3 02/01/2020   Lab Results  Component Value Date   INSULIN 21.5 01/20/2022   Lab Results  Component Value Date   TSH 2.200 01/20/2022   No results found for: "CHOL", "HDL", "LDLCALC", "LDLDIRECT", "TRIG",  "CHOLHDL" Lab Results  Component Value Date   WBC 9.0 05/15/2020   HGB 14.2 05/15/2020   HCT 44.3 05/15/2020   MCV 87.4 05/15/2020   PLT 382 05/15/2020   No results found for: "IRON", "TIBC", "FERRITIN"  Attestation Statements:   Reviewed by clinician on day of visit: allergies, medications, problem list, medical history, surgical history, family history, social history, and previous encounter notes.   05/17/2020, am acting as transcriptionist for Trude Mcburney, DO.  I have reviewed the above documentation for accuracy and completeness, and I agree with the above. - ***

## 2022-02-03 ENCOUNTER — Encounter (INDEPENDENT_AMBULATORY_CARE_PROVIDER_SITE_OTHER): Payer: Self-pay | Admitting: Family Medicine

## 2022-02-03 ENCOUNTER — Ambulatory Visit (INDEPENDENT_AMBULATORY_CARE_PROVIDER_SITE_OTHER): Payer: Managed Care, Other (non HMO) | Admitting: Family Medicine

## 2022-02-03 VITALS — BP 117/80 | HR 89 | Temp 98.3°F | Ht 68.0 in | Wt 224.0 lb

## 2022-02-03 DIAGNOSIS — E88819 Insulin resistance, unspecified: Secondary | ICD-10-CM

## 2022-02-03 DIAGNOSIS — F419 Anxiety disorder, unspecified: Secondary | ICD-10-CM

## 2022-02-03 DIAGNOSIS — R7303 Prediabetes: Secondary | ICD-10-CM

## 2022-02-03 DIAGNOSIS — E559 Vitamin D deficiency, unspecified: Secondary | ICD-10-CM | POA: Diagnosis not present

## 2022-02-03 DIAGNOSIS — E282 Polycystic ovarian syndrome: Secondary | ICD-10-CM

## 2022-02-03 DIAGNOSIS — Z6834 Body mass index (BMI) 34.0-34.9, adult: Secondary | ICD-10-CM

## 2022-02-03 DIAGNOSIS — E8881 Metabolic syndrome: Secondary | ICD-10-CM

## 2022-02-03 DIAGNOSIS — F32A Depression, unspecified: Secondary | ICD-10-CM

## 2022-02-03 DIAGNOSIS — E669 Obesity, unspecified: Secondary | ICD-10-CM

## 2022-02-03 DIAGNOSIS — E66811 Obesity, class 1: Secondary | ICD-10-CM

## 2022-02-03 MED ORDER — VITAMIN D (ERGOCALCIFEROL) 1.25 MG (50000 UNIT) PO CAPS: 50000.0000 [IU] | ORAL_CAPSULE | ORAL | 0 refills | Status: DC

## 2022-02-03 MED ORDER — LOMAIRA 8 MG PO TABS: ORAL_TABLET | ORAL | 0 refills | Status: DC

## 2022-02-05 NOTE — Progress Notes (Signed)
Chief Complaint:   OBESITY Annette Blevins is here to discuss her progress with her obesity treatment plan along with follow-up of her obesity related diagnoses. Annette Blevins is on the Category 4 Plan and states she is following her eating plan approximately 95% of the time. Annette Blevins states she is walking and lifting weights 30 minutes 3 times per week.  Today's visit was #: 2 Starting weight: 227 lbs Starting date: 01/20/2022 Today's weight: 224 lbs Today's date: 02/03/2022 Total lbs lost to date: 3 lbs Total lbs lost since last in-office visit: 3 lbs  Interim History: Trying to stick to meal plan, but her husband brings candy in the house.  Trying to practice mindful eating.  Added in more walking and weight lifting.  Moved dinner to earlier and reduced meal out.  Had a trip to the beach.  Avoiding SSB's.  Over eating snacks at night.    Subjective:   1. Anxiety and depression On sertraline 50 mg daily.  Previously had side effects on Wellbutrin.  Stress levels high.   2. Vitamin D deficiency New diagnosis.  Discussed labs with patient today. Vitamin D level 19.    3. Insulin resistance Discussed labs with patient today. Fasting insulin 21.5.  Started working on reducing high intake of sugar.    4. PCOS (polycystic ovarian syndrome) Has never used metformin.  On OCP's to regulate cycle.   5. Pre-diabetes Last A1c is 5.7.  Assessment/Plan:   1. Anxiety and depression Continue sertraline 50 mg daily with PCP.     2. Vitamin D deficiency Begin - Vitamin D, Ergocalciferol, (DRISDOL) 1.25 MG (50000 UNIT) CAPS capsule; Take 1 capsule (50,000 Units total) by mouth every 7 (seven) days.  Dispense: 5 capsule; Refill: 0  3. Insulin resistance Continue to work on a low sugar diet, increase walking time.    4. PCOS (polycystic ovarian syndrome) Continue to work on a low sugar diet, weight loss. Consider metformin.   5. Pre-diabetes Recheck A1c in 5-6 months.   6. Obesity,current BMI  34.1 Begin- Phentermine HCl (LOMAIRA) 8 MG TABS; 1 tab po at 4 pm daily  Dispense: 28 tablet; Refill: 0 Informed consent signed. Reviewed MOA and potential adverse SEs.  Annette Blevins is currently in the action stage of change. As such, her goal is to continue with weight loss efforts. She has agreed to the Category 4 Plan.   Exercise goals:  as is.   Behavioral modification strategies: increasing lean protein intake, increasing vegetables, increasing water intake, increasing high fiber foods, decreasing eating out, no skipping meals, better snacking choices, avoiding temptations, planning for success, and decreasing junk food.  Annette Blevins has agreed to follow-up with our clinic in 2 weeks. She was informed of the importance of frequent follow-up visits to maximize her success with intensive lifestyle modifications for her multiple health conditions.   Objective:   Blood pressure 117/80, pulse 89, temperature 98.3 F (36.8 C), height 5\' 8"  (1.727 m), weight 224 lb (101.6 kg), last menstrual period 12/29/2021, SpO2 98 %. Body mass index is 34.06 kg/m.  General: Cooperative, alert, well developed, in no acute distress. HEENT: Conjunctivae and lids unremarkable. Cardiovascular: Regular rhythm.  Lungs: Normal work of breathing. Neurologic: No focal deficits.   Lab Results  Component Value Date   CREATININE 0.80 05/15/2020   BUN 12 05/15/2020   NA 137 05/15/2020   K 3.9 05/15/2020   CL 102 05/15/2020   CO2 22 05/15/2020   Lab Results  Component Value Date  ALT 26 05/15/2020   AST 27 05/15/2020   ALKPHOS 83 05/15/2020   BILITOT 0.3 05/15/2020   Lab Results  Component Value Date   HGBA1C 5.3 02/01/2020   Lab Results  Component Value Date   INSULIN 21.5 01/20/2022   Lab Results  Component Value Date   TSH 2.200 01/20/2022   No results found for: "CHOL", "HDL", "LDLCALC", "LDLDIRECT", "TRIG", "CHOLHDL" Lab Results  Component Value Date   VD25OH 19.0 (L) 01/20/2022   Lab Results   Component Value Date   WBC 9.0 05/15/2020   HGB 14.2 05/15/2020   HCT 44.3 05/15/2020   MCV 87.4 05/15/2020   PLT 382 05/15/2020   No results found for: "IRON", "TIBC", "FERRITIN"  Attestation Statements:   Reviewed by clinician on day of visit: allergies, medications, problem list, medical history, surgical history, family history, social history, and previous encounter notes.  I, Davy Pique, am acting as Location manager for Loyal Gambler, DO.  I have reviewed the above documentation for accuracy and completeness, and I agree with the above. Dell Ponto, DO

## 2022-02-18 ENCOUNTER — Encounter (INDEPENDENT_AMBULATORY_CARE_PROVIDER_SITE_OTHER): Payer: Self-pay | Admitting: Family Medicine

## 2022-02-18 ENCOUNTER — Ambulatory Visit (INDEPENDENT_AMBULATORY_CARE_PROVIDER_SITE_OTHER): Payer: Managed Care, Other (non HMO) | Admitting: Family Medicine

## 2022-02-18 VITALS — BP 123/8 | HR 89 | Temp 97.7°F | Ht 68.0 in | Wt 224.0 lb

## 2022-02-18 DIAGNOSIS — E669 Obesity, unspecified: Secondary | ICD-10-CM | POA: Diagnosis not present

## 2022-02-18 DIAGNOSIS — E88819 Insulin resistance, unspecified: Secondary | ICD-10-CM

## 2022-02-18 DIAGNOSIS — Z6834 Body mass index (BMI) 34.0-34.9, adult: Secondary | ICD-10-CM

## 2022-02-18 DIAGNOSIS — E282 Polycystic ovarian syndrome: Secondary | ICD-10-CM | POA: Diagnosis not present

## 2022-02-18 DIAGNOSIS — E559 Vitamin D deficiency, unspecified: Secondary | ICD-10-CM

## 2022-02-18 MED ORDER — VITAMIN D (ERGOCALCIFEROL) 1.25 MG (50000 UNIT) PO CAPS: 50000.0000 [IU] | ORAL_CAPSULE | ORAL | 0 refills | Status: DC

## 2022-02-18 MED ORDER — LOMAIRA 8 MG PO TABS: ORAL_TABLET | ORAL | 0 refills | Status: DC

## 2022-02-26 NOTE — Progress Notes (Signed)
Chief Complaint:   OBESITY Annette Blevins is here to discuss her progress with her obesity treatment plan along with follow-up of her obesity related diagnoses. Annette Blevins is on the Category 4 Plan and states she is following her eating plan approximately 75% of the time. Annette Blevins states she is taking yoga classes and roller skating 30-60 minutes 3-5 times per week.  Today's visit was #: 3 Starting weight: 227 lbs Starting date: 01/20/2022 Today's weight: 224 lbs Today's date: 02/18/2022 Total lbs lost to date: 3 lbs Total lbs lost since last in-office visit: 0   Interim History: Stressors are high with husband in the hospital and had a show, but feels back on track.  She was eating out more and meal skipping.  She started Lomaira 8 mg tablets for evening hunger, but not taking everyday.  Did not cause meal skipping. Likes the improvements in portions and shacking from [place].    Subjective:   1. Vitamin D deficiency She is on prescription Vitamin D 50,000 IU weekly.  Last Vitamin D level 19.0.  2. Insulin resistance Last fasting insulin 21.5.  She has reduced sugar cravings.   3. PCOS (polycystic ovarian syndrome) Menses regulated by [facility].  She is not on metformin.   Assessment/Plan:   1. Vitamin D deficiency Recheck in January.  Refill - Vitamin D, Ergocalciferol, (DRISDOL) 1.25 MG (50000 UNIT) CAPS capsule; Take 1 capsule (50,000 Units total) by mouth every 7 (seven) days.  Dispense: 5 capsule; Refill: 0  2. Insulin resistance Plan to recheck fasting insulin in January.    3. PCOS (polycystic ovarian syndrome) Consider metformin if fasting insulin increases.   4. Obesity,current BMI 34.1 1) Discussed options to add in more consistent exercise.  2) Refill - Phentermine HCl (LOMAIRA) 8 MG TABS; 1 tab po at 4 pm daily  Dispense: 28 tablet; Refill: 0  Annette Blevins is currently in the action stage of change. As such, her goal is to continue with weight loss efforts. She has agreed to the  Category 4 Plan + 100 protein daily.   Exercise goals:  As is.   Behavioral modification strategies: increasing lean protein intake, decreasing simple carbohydrates, increasing vegetables, increasing water intake, decreasing eating out, no skipping meals, meal planning and cooking strategies, better snacking choices, and decreasing junk food.  Annette Blevins has agreed to follow-up with our clinic in 3-4 weeks. She was informed of the importance of frequent follow-up visits to maximize her success with intensive lifestyle modifications for her multiple health conditions.   Objective:   Blood pressure (!) 123/8, pulse 89, temperature 97.7 F (36.5 C), height 5\' 8"  (1.727 m), weight 224 lb (101.6 kg), last menstrual period 12/29/2021, SpO2 96 %. Body mass index is 34.06 kg/m.  General: Cooperative, alert, well developed, in no acute distress. HEENT: Conjunctivae and lids unremarkable. Cardiovascular: Regular rhythm.  Lungs: Normal work of breathing. Neurologic: No focal deficits.   Lab Results  Component Value Date   CREATININE 0.80 05/15/2020   BUN 12 05/15/2020   NA 137 05/15/2020   K 3.9 05/15/2020   CL 102 05/15/2020   CO2 22 05/15/2020   Lab Results  Component Value Date   ALT 26 05/15/2020   AST 27 05/15/2020   ALKPHOS 83 05/15/2020   BILITOT 0.3 05/15/2020   Lab Results  Component Value Date   HGBA1C 5.3 02/01/2020   Lab Results  Component Value Date   INSULIN 21.5 01/20/2022   Lab Results  Component Value Date  TSH 2.200 01/20/2022   No results found for: "CHOL", "HDL", "LDLCALC", "LDLDIRECT", "TRIG", "CHOLHDL" Lab Results  Component Value Date   VD25OH 19.0 (L) 01/20/2022   Lab Results  Component Value Date   WBC 9.0 05/15/2020   HGB 14.2 05/15/2020   HCT 44.3 05/15/2020   MCV 87.4 05/15/2020   PLT 382 05/15/2020   No results found for: "IRON", "TIBC", "FERRITIN"  Attestation Statements:   Reviewed by clinician on day of visit: allergies, medications,  problem list, medical history, surgical history, family history, social history, and previous encounter notes.  I, Davy Pique, am acting as Location manager for Loyal Gambler, DO.  I have reviewed the above documentation for accuracy and completeness, and I agree with the above. Dell Ponto, DO

## 2022-03-10 ENCOUNTER — Ambulatory Visit (INDEPENDENT_AMBULATORY_CARE_PROVIDER_SITE_OTHER): Payer: Managed Care, Other (non HMO) | Admitting: Family Medicine

## 2022-03-12 ENCOUNTER — Telehealth (INDEPENDENT_AMBULATORY_CARE_PROVIDER_SITE_OTHER): Payer: Self-pay | Admitting: Internal Medicine

## 2022-03-31 ENCOUNTER — Ambulatory Visit (INDEPENDENT_AMBULATORY_CARE_PROVIDER_SITE_OTHER): Payer: Managed Care, Other (non HMO) | Admitting: Family Medicine

## 2022-03-31 ENCOUNTER — Encounter (INDEPENDENT_AMBULATORY_CARE_PROVIDER_SITE_OTHER): Payer: Self-pay | Admitting: Family Medicine

## 2022-03-31 VITALS — BP 131/82 | HR 94 | Temp 97.5°F | Ht 68.0 in | Wt 218.0 lb

## 2022-03-31 DIAGNOSIS — E669 Obesity, unspecified: Secondary | ICD-10-CM

## 2022-03-31 DIAGNOSIS — F329 Major depressive disorder, single episode, unspecified: Secondary | ICD-10-CM

## 2022-03-31 DIAGNOSIS — E282 Polycystic ovarian syndrome: Secondary | ICD-10-CM | POA: Diagnosis not present

## 2022-03-31 DIAGNOSIS — Z6834 Body mass index (BMI) 34.0-34.9, adult: Secondary | ICD-10-CM

## 2022-03-31 DIAGNOSIS — E559 Vitamin D deficiency, unspecified: Secondary | ICD-10-CM | POA: Diagnosis not present

## 2022-03-31 DIAGNOSIS — Z6833 Body mass index (BMI) 33.0-33.9, adult: Secondary | ICD-10-CM

## 2022-03-31 MED ORDER — VITAMIN D (ERGOCALCIFEROL) 1.25 MG (50000 UNIT) PO CAPS: 50000.0000 [IU] | ORAL_CAPSULE | ORAL | 0 refills | Status: DC

## 2022-03-31 MED ORDER — LOMAIRA 8 MG PO TABS: ORAL_TABLET | ORAL | 0 refills | Status: DC

## 2022-04-01 DIAGNOSIS — F329 Major depressive disorder, single episode, unspecified: Secondary | ICD-10-CM | POA: Insufficient documentation
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
## 2022-04-13 NOTE — Progress Notes (Unsigned)
  TeleHealth Visit:  This visit was completed with telemedicine (audio/video) technology. Annette Blevins has verbally consented to this TeleHealth visit. The patient is located at home, the provider is located at home. The participants in this visit include the listed provider and patient. The visit was conducted today via MyChart video.  OBESITY Annette Blevins is here to discuss her progress with her obesity treatment plan along with follow-up of her obesity related diagnoses.   Today's visit was # 5 Starting weight: 227 lbs Starting date: 01/20/2022 Weight at last in office visit: 218 lbs on 03/21/22 Total weight loss: 9 lbs at last in office visit on 03/21/22. Today's reported weight: *** lbs No weight reported.  Nutrition Plan: the Category 4 Plan + 100 calories  Current exercise: {exercise types:16438} walking and yoga 60 minutes 1 times per week.  Interim History: ***  Assessment/Plan:  1. ***  2. ***  3. ***  Obesity: Current BMI *** Annette Blevins {CHL AMB IS/IS NOT:210130109} currently in the action stage of change. As such, her goal is to {MWMwtloss#1:210800005}.  She has agreed to {MWMwtlossportion/plan2:23431}.   Exercise goals: {MWM EXERCISE RECS:23473}  Behavioral modification strategies: {MWMwtlossdietstrategies3:23432}.  Annette Blevins has agreed to follow-up with our clinic in {NUMBER 1-10:22536} weeks.   No orders of the defined types were placed in this encounter.   There are no discontinued medications.   No orders of the defined types were placed in this encounter.     Objective:   VITALS: Per patient if applicable, see vitals. GENERAL: Alert and in no acute distress. CARDIOPULMONARY: No increased WOB. Speaking in clear sentences.  PSYCH: Pleasant and cooperative. Speech normal rate and rhythm. Affect is appropriate. Insight and judgement are appropriate. Attention is focused, linear, and appropriate.  NEURO: Oriented as arrived to appointment on time with no prompting.    Lab Results  Component Value Date   CREATININE 0.80 05/15/2020   BUN 12 05/15/2020   NA 137 05/15/2020   K 3.9 05/15/2020   CL 102 05/15/2020   CO2 22 05/15/2020   Lab Results  Component Value Date   ALT 26 05/15/2020   AST 27 05/15/2020   ALKPHOS 83 05/15/2020   BILITOT 0.3 05/15/2020   Lab Results  Component Value Date   HGBA1C 5.3 02/01/2020   Lab Results  Component Value Date   INSULIN 21.5 01/20/2022   Lab Results  Component Value Date   TSH 2.200 01/20/2022   No results found for: "CHOL", "HDL", "LDLCALC", "LDLDIRECT", "TRIG", "CHOLHDL" Lab Results  Component Value Date   WBC 9.0 05/15/2020   HGB 14.2 05/15/2020   HCT 44.3 05/15/2020   MCV 87.4 05/15/2020   PLT 382 05/15/2020   No results found for: "IRON", "TIBC", "FERRITIN" Lab Results  Component Value Date   VD25OH 19.0 (L) 01/20/2022    Attestation Statements:   Reviewed by clinician on day of visit: allergies, medications, problem list, medical history, surgical history, family history, social history, and previous encounter notes.  ***(delete if time-based billing not used) Time spent on visit including the items listed below was *** minutes.  -preparing to see the patient (e.g., review of tests, history, previous notes) -obtaining and/or reviewing separately obtained history -counseling and educating the patient/family/caregiver -documenting clinical information in the electronic or other health record -ordering medications, tests, or procedures -independently interpreting results and communicating results to the patient/ family/caregiver -referring and communicating with other health care professionals  -care coordination

## 2022-04-14 ENCOUNTER — Encounter (INDEPENDENT_AMBULATORY_CARE_PROVIDER_SITE_OTHER): Payer: Self-pay | Admitting: Family Medicine

## 2022-04-14 ENCOUNTER — Telehealth (INDEPENDENT_AMBULATORY_CARE_PROVIDER_SITE_OTHER): Payer: Managed Care, Other (non HMO) | Admitting: Family Medicine

## 2022-04-14 DIAGNOSIS — F32A Depression, unspecified: Secondary | ICD-10-CM

## 2022-04-14 DIAGNOSIS — E559 Vitamin D deficiency, unspecified: Secondary | ICD-10-CM

## 2022-04-14 DIAGNOSIS — F419 Anxiety disorder, unspecified: Secondary | ICD-10-CM

## 2022-04-14 DIAGNOSIS — Z6833 Body mass index (BMI) 33.0-33.9, adult: Secondary | ICD-10-CM

## 2022-04-14 DIAGNOSIS — E669 Obesity, unspecified: Secondary | ICD-10-CM | POA: Diagnosis not present

## 2022-04-16 IMAGING — DX DG KNEE AP/LAT W/ SUNRISE*R*
3 series · 3 of 3 positions shown · non-contrast
Comparison: None.

CLINICAL DATA: Right knee pain.

EXAM:
RIGHT KNEE 3 VIEWS

[dg knee ap/lat w/ sunrise right (1 of 3)]
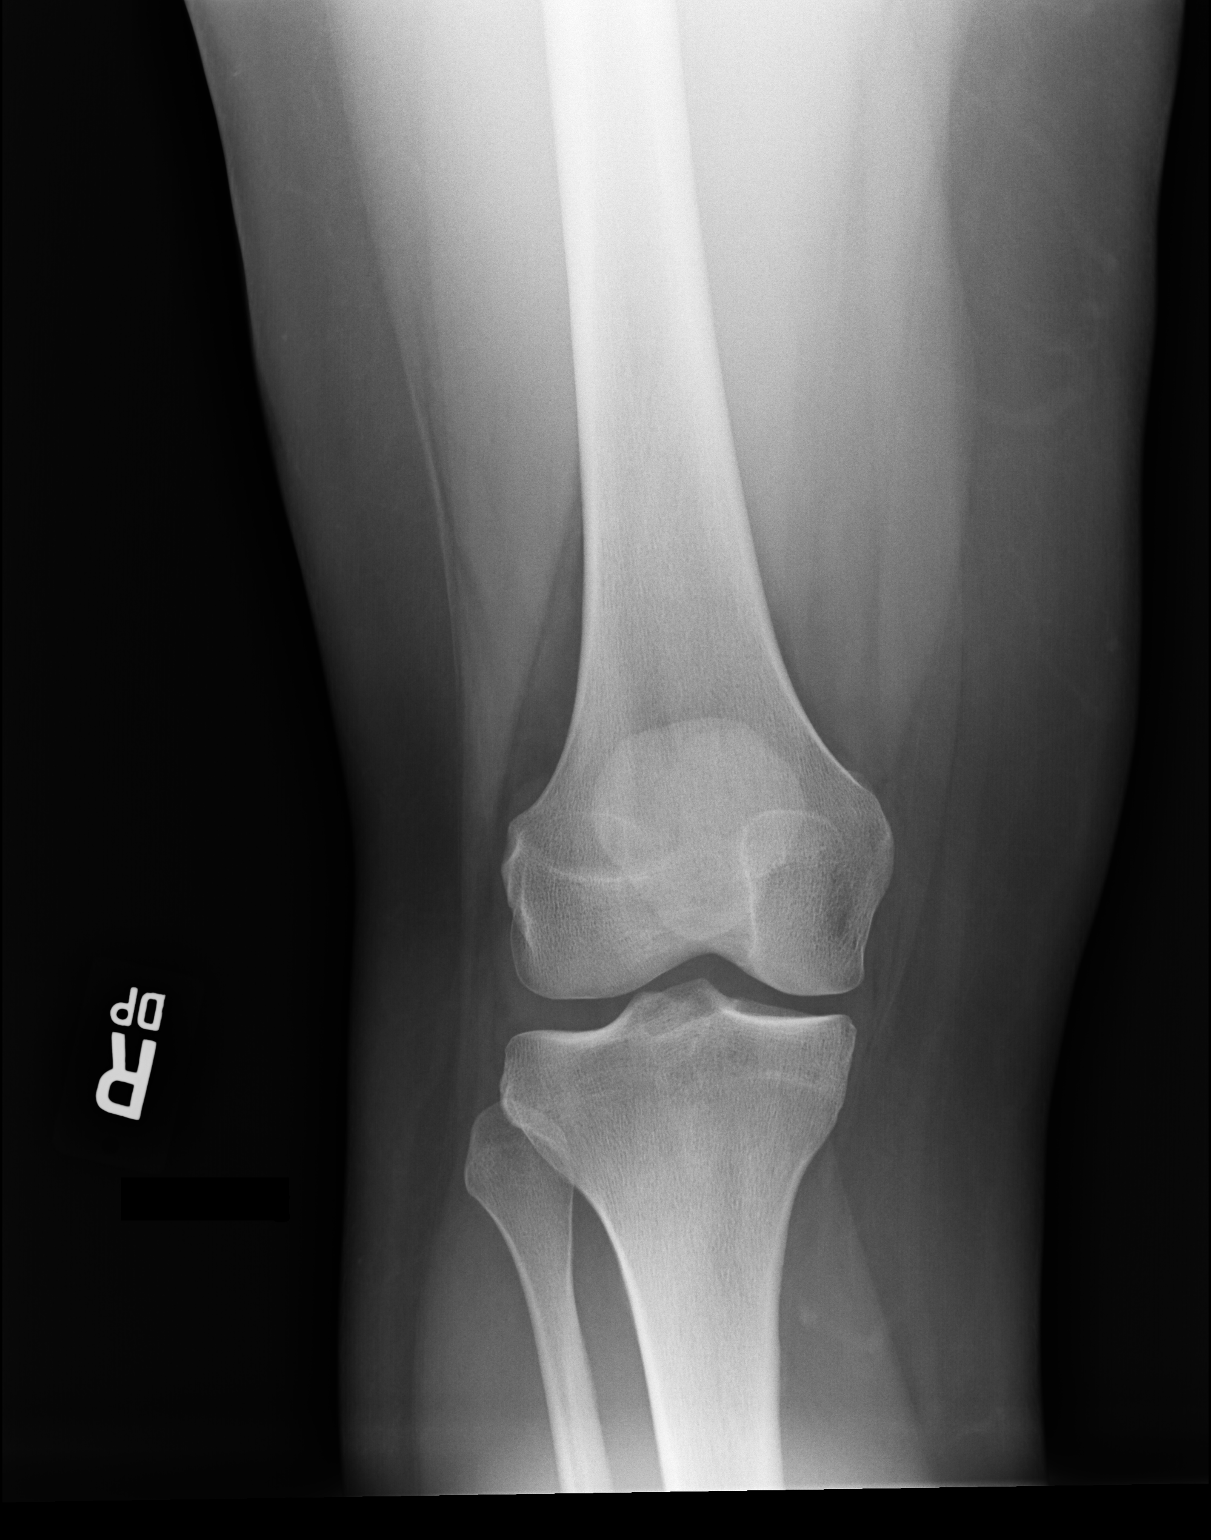

[dg knee ap/lat w/ sunrise right (2 of 3)]
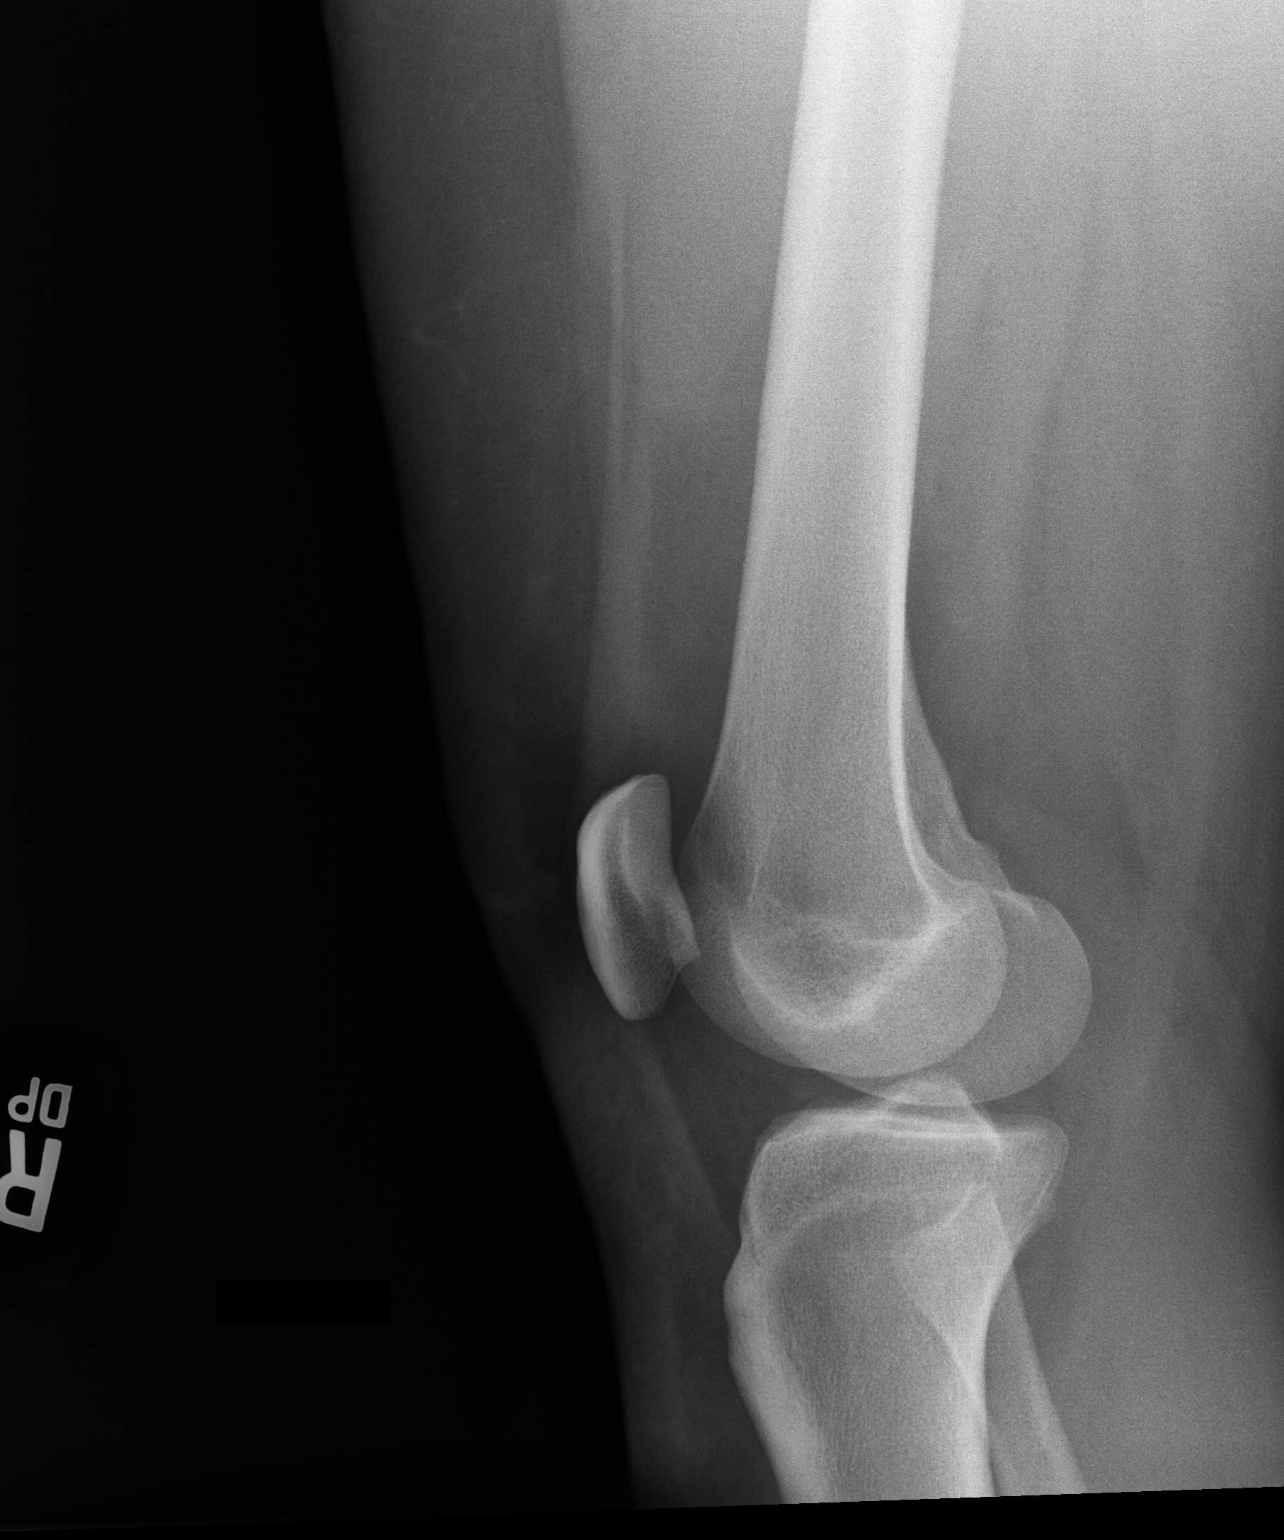

[dg knee ap/lat w/ sunrise right (3 of 3)]
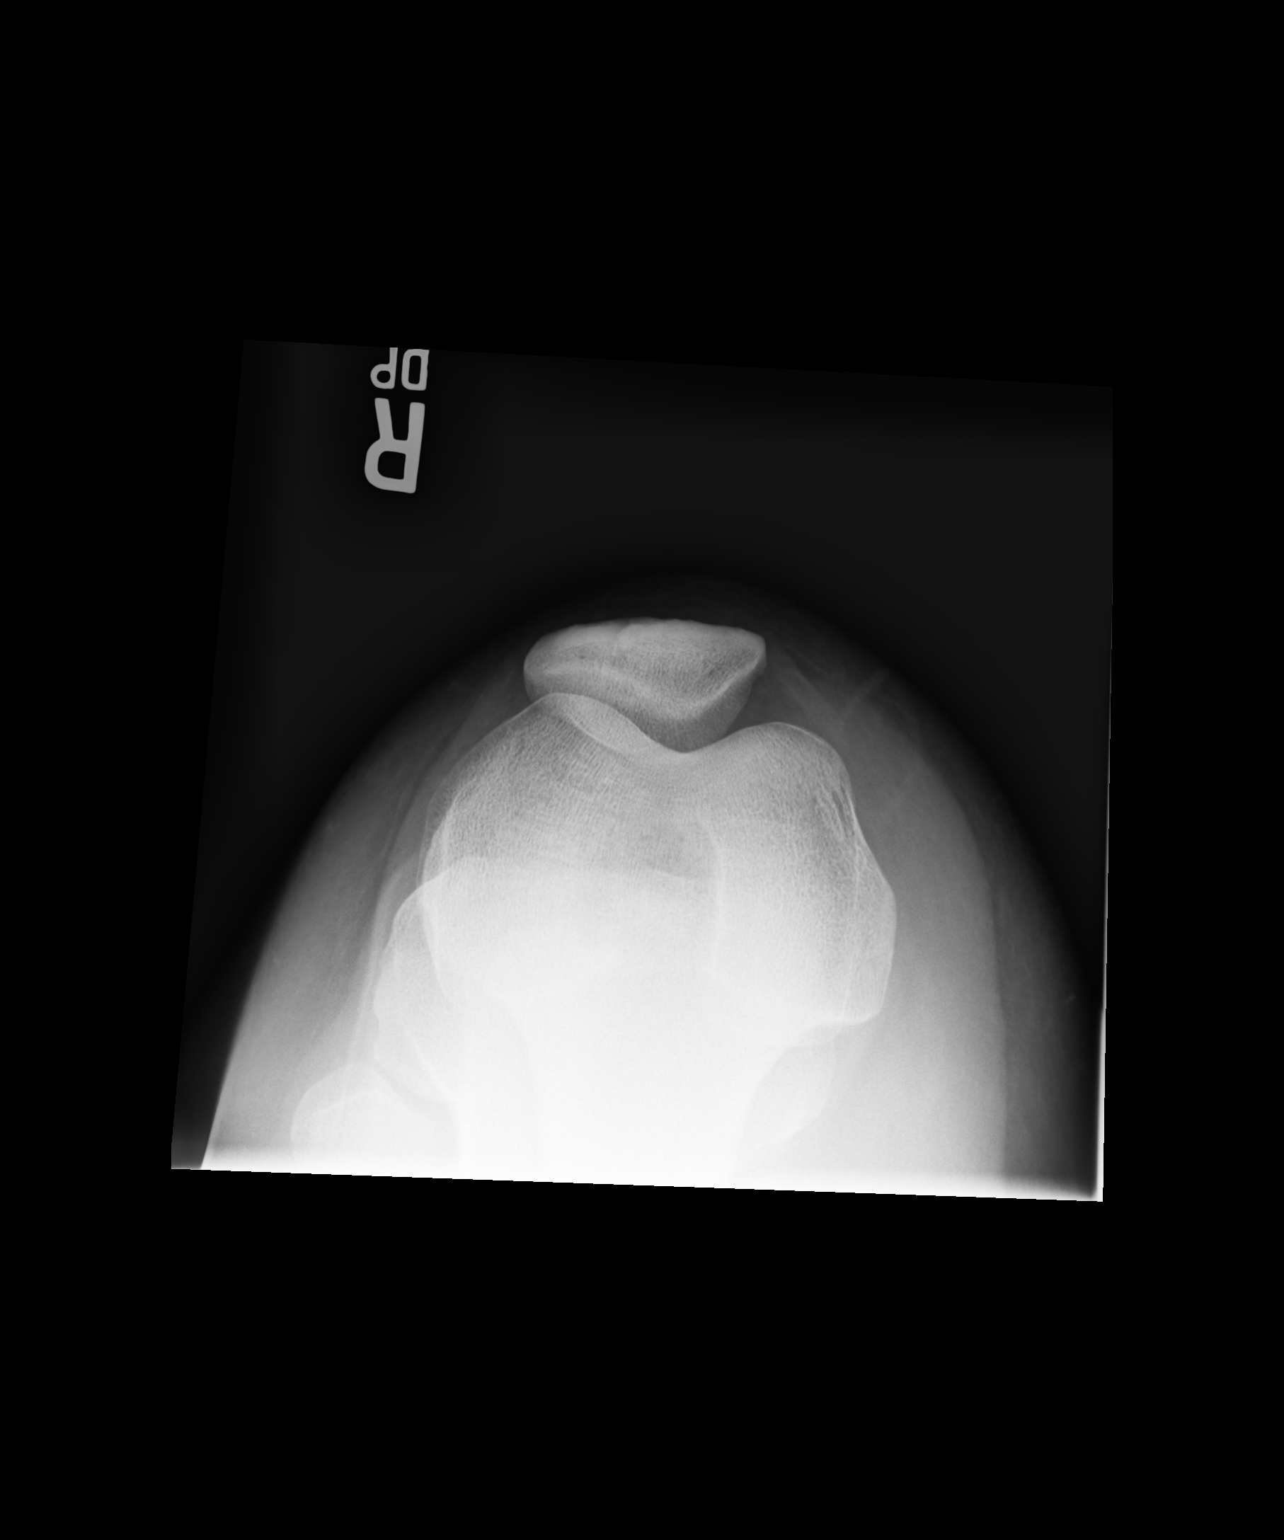

[3 of 3 positions shown; findings below may reference images not displayed]

FINDINGS: No evidence of fracture, dislocation, or joint effusion. No evidence
of arthropathy or other focal bone abnormality. Soft tissues are
unremarkable.
IMPRESSION: Negative.

## 2022-04-29 ENCOUNTER — Ambulatory Visit (INDEPENDENT_AMBULATORY_CARE_PROVIDER_SITE_OTHER): Payer: Managed Care, Other (non HMO) | Admitting: Family Medicine

## 2022-04-30 ENCOUNTER — Ambulatory Visit (INDEPENDENT_AMBULATORY_CARE_PROVIDER_SITE_OTHER): Payer: Managed Care, Other (non HMO) | Admitting: Family Medicine

## 2022-05-13 ENCOUNTER — Telehealth (INDEPENDENT_AMBULATORY_CARE_PROVIDER_SITE_OTHER): Payer: Managed Care, Other (non HMO) | Admitting: Family Medicine

## 2022-05-25 ENCOUNTER — Encounter (INDEPENDENT_AMBULATORY_CARE_PROVIDER_SITE_OTHER): Payer: Self-pay | Admitting: Family Medicine

## 2022-05-25 ENCOUNTER — Ambulatory Visit (INDEPENDENT_AMBULATORY_CARE_PROVIDER_SITE_OTHER): Payer: 59 | Admitting: Family Medicine

## 2022-05-25 ENCOUNTER — Other Ambulatory Visit (HOSPITAL_COMMUNITY): Payer: Self-pay

## 2022-05-25 ENCOUNTER — Telehealth (INDEPENDENT_AMBULATORY_CARE_PROVIDER_SITE_OTHER): Payer: Self-pay | Admitting: Family Medicine

## 2022-05-25 VITALS — BP 109/73 | HR 113 | Ht 68.0 in | Wt 220.0 lb

## 2022-05-25 DIAGNOSIS — Z6833 Body mass index (BMI) 33.0-33.9, adult: Secondary | ICD-10-CM

## 2022-05-25 DIAGNOSIS — K580 Irritable bowel syndrome with diarrhea: Secondary | ICD-10-CM | POA: Diagnosis not present

## 2022-05-25 DIAGNOSIS — E88819 Insulin resistance, unspecified: Secondary | ICD-10-CM

## 2022-05-25 DIAGNOSIS — E559 Vitamin D deficiency, unspecified: Secondary | ICD-10-CM | POA: Diagnosis not present

## 2022-05-25 DIAGNOSIS — F39 Unspecified mood [affective] disorder: Secondary | ICD-10-CM

## 2022-05-25 DIAGNOSIS — E669 Obesity, unspecified: Secondary | ICD-10-CM

## 2022-05-25 MED ORDER — VITAMIN D (ERGOCALCIFEROL) 1.25 MG (50000 UNIT) PO CAPS: 50000.0000 [IU] | ORAL_CAPSULE | ORAL | 0 refills | Status: DC

## 2022-05-25 MED ORDER — WEGOVY 0.25 MG/0.5ML ~~LOC~~ SOAJ
0.2500 mg | SUBCUTANEOUS | 0 refills | Status: DC
  Filled 2022-05-25: qty 2, fill #0

## 2022-05-25 NOTE — Telephone Encounter (Signed)
Patient stated her pharmacy is backorder for [facility]. Can she get it changed to a different pharmacy? Please call patient

## 2022-05-26 ENCOUNTER — Other Ambulatory Visit (INDEPENDENT_AMBULATORY_CARE_PROVIDER_SITE_OTHER): Payer: Self-pay | Admitting: Family Medicine

## 2022-05-26 DIAGNOSIS — F39 Unspecified mood [affective] disorder: Secondary | ICD-10-CM | POA: Insufficient documentation

## 2022-05-26 DIAGNOSIS — K580 Irritable bowel syndrome with diarrhea: Secondary | ICD-10-CM | POA: Insufficient documentation

## 2022-05-26 MED ORDER — WEGOVY 0.25 MG/0.5ML ~~LOC~~ SOAJ
0.2500 mg | SUBCUTANEOUS | 0 refills | Status: DC
  Filled 2022-05-26: qty 2, fill #0
  Filled 2022-05-27 – 2022-06-24 (×2): qty 2, 28d supply, fill #0

## 2022-05-26 NOTE — Telephone Encounter (Signed)
Phone call to patient to see which pharmacy she would like her medication sent. She said either [place] or a pharmacy in [place] [address] Valetta Close uses.

## 2022-05-27 ENCOUNTER — Other Ambulatory Visit: Payer: Self-pay

## 2022-05-27 ENCOUNTER — Other Ambulatory Visit (HOSPITAL_COMMUNITY): Payer: Self-pay

## 2022-05-27 NOTE — Telephone Encounter (Signed)
Phone call to patient to let her know that her medication is now at the [place] [facility] out patient pharmacy.

## 2022-06-08 NOTE — Progress Notes (Signed)
Chief Complaint:   OBESITY Annette Blevins is here to discuss her progress with her obesity treatment plan along with follow-up of her obesity related diagnoses. Annette Blevins is on the Category 4 Plan+100 protein and states she is following her eating plan approximately 85% of the time. [name] states she is doing water aerobics and yoga 60 minutes 2 times per week.  Today's visit was #: 6 Starting weight: 227 lbs Starting date: 01/20/2022 Today's weight: 220 lbs Today's date: 05/25/2022 Total lbs lost to date: 7 lbs Total lbs lost since last in-office visit: +2 lbs  Interim History: Patient stopped Lomaira 8 mg twice daily in mid December.  Diastolic blood pressures were rising and anxiety was worsening.  She  is craving more fast food but finding healthier alternatives.  she has been cooking at home more.  Patient started water exercise 2 times a week.  Complains of cravings.  Subjective:   1. Vitamin D deficiency Patient ran out of vitamin D 4 weeks ago and has been more fatigued.  Vitamin D level was 19 on 01/20/2022.  2. Irritable bowel syndrome with diarrhea Due to IBS diarrhea we will avoid the use of metformin for insulin resistance.  3. Insulin resistance Last fasting insulin 21.5 on 01/20/2022. patient has been actively reducing intake of starches and sweets.  4. Mood disorder ([place]) PCP increased sertraline from 50 mg to 100 mg in mid December but stopped Lomaira.  Mood has improved now.   has a good support system.  Assessment/Plan:   1. Vitamin D deficiency Recheck vitamin D level in February 2024.  Refill- Vitamin D, Ergocalciferol, (DRISDOL) 1.25 MG (50000 UNIT) CAPS capsule; Take 1 capsule (50,000 Units total) by mouth every 7 (seven) days.  Dispense: 5 capsule; Refill: 0  2. Irritable bowel syndrome with diarrhea Continue to work on stress reduction and avoiding food triggers that worsen IBS.  3. Insulin resistance Recheck fasting insulin and A1c in February 2024.  Look for  improvements.  4. Mood disorder (HCC) Continue sertraline 100 mg daily, patient declines counseling.  5. Obesity,current BMI 33.5 1.  Begin Wegovy 0.25 mg weekly #2 no refills.  Check out [email or web address] for savings card and pen training video.  Patient is on OCPs for birth control.  Patient denies a personal or family history of pancreatitis, medullary thyroid carcinoma or multiple endocrine neoplasia type II. Recommend reviewing pen training video online. 2.  Breakfast alternative handout given.  Bexleigh is currently in the action stage of change. As such, her goal is to continue with weight loss efforts. She has agreed to the Category 4 Plan +100 protein daily..   Exercise goals:  As is.  Behavioral modification strategies: increasing lean protein intake, increasing vegetables, increasing water intake, decreasing eating out, no skipping meals, meal planning and cooking strategies, keeping healthy foods in the home, and emotional eating strategiesand planning for success.   [name] has agreed to follow-up with our clinic in 2 weeks. She was informed of the importance of frequent follow-up visits to maximize her success with intensive lifestyle modifications for her multiple health conditions.   Objective:   Blood pressure 109/73, pulse (!) 113, height 5\' 8"  (1.727 m), weight 220 lb (99.8 kg), SpO2 96 %. Body mass index is 33.45 kg/m.  General: Cooperative, alert, well developed, in no acute distress. HEENT: Conjunctivae and lids unremarkable. Cardiovascular: Regular rhythm.  Lungs: Normal work of breathing. Neurologic: No focal deficits.   Lab Results  Component Value Date   CREATININE  0.80 05/15/2020   BUN 12 05/15/2020   NA 137 05/15/2020   K 3.9 05/15/2020   CL 102 05/15/2020   CO2 22 05/15/2020   Lab Results  Component Value Date   ALT 26 05/15/2020   AST 27 05/15/2020   ALKPHOS 83 05/15/2020   BILITOT 0.3 05/15/2020   Lab Results  Component Value Date   HGBA1C 5.3  02/01/2020   Lab Results  Component Value Date   INSULIN 21.5 01/20/2022   Lab Results  Component Value Date   TSH 2.200 01/20/2022   No results found for: "CHOL", "HDL", "LDLCALC", "LDLDIRECT", "TRIG", "CHOLHDL" Lab Results  Component Value Date   VD25OH 19.0 (L) 01/20/2022   Lab Results  Component Value Date   WBC 9.0 05/15/2020   HGB 14.2 05/15/2020   HCT 44.3 05/15/2020   MCV 87.4 05/15/2020   PLT 382 05/15/2020   No results found for: "IRON", "TIBC", "FERRITIN"  Attestation Statements:   Reviewed by clinician on day of visit: allergies, medications, problem list, medical history, surgical history, family history, social history, and previous encounter notes.  I, Davy Pique, am acting as Location manager for Loyal Gambler, DO.  I have reviewed the above documentation for accuracy and completeness, and I agree with the above. Dell Ponto, DO

## 2022-06-10 NOTE — Progress Notes (Signed)
  TeleHealth Visit:  This visit was completed with telemedicine (audio/video) technology. Jyrah has verbally consented to this TeleHealth visit. The patient is located at home, the provider is located at home. The participants in this visit include the listed provider and patient. The visit was conducted today via MyChart video.  OBESITY Leshae is here to discuss her progress with her obesity treatment plan along with follow-up of her obesity related diagnoses.   Today's visit was # 7 Starting weight: 227 lbs Starting date: 01/20/2022 Weight at last in office visit: 220 lbs on 05/25/22 Total weight loss: 7 lbs at last in office visit on 05/25/22. Today's reported weight: *** lbs No weight reported.  Nutrition Plan: the Category 4 Plan plus 100 calories  Current exercise: {exercise types:16438} water aerobics and yoga 60 minutes 2 times per week.  Interim History:  ***  Assessment/Plan:  1. ***  2. ***  3. ***  Obesity: Current BMI *** Aleiah {CHL AMB IS/IS NOT:210130109} currently in the action stage of change. As such, her goal is to {MWMwtloss#1:210800005}.  She has agreed to {MWMwtlossportion/plan2:23431}.   Exercise goals: {MWM EXERCISE RECS:23473}  Behavioral modification strategies: {MWMwtlossdietstrategies3:23432}.  Matison has agreed to follow-up with our clinic in {NUMBER 1-10:22536} weeks.   No orders of the defined types were placed in this encounter.   There are no discontinued medications.   No orders of the defined types were placed in this encounter.     Objective:   VITALS: Per patient if applicable, see vitals. GENERAL: Alert and in no acute distress. CARDIOPULMONARY: No increased WOB. Speaking in clear sentences.  PSYCH: Pleasant and cooperative. Speech normal rate and rhythm. Affect is appropriate. Insight and judgement are appropriate. Attention is focused, linear, and appropriate.  NEURO: Oriented as arrived to appointment on time with no  prompting.   Lab Results  Component Value Date   CREATININE 0.80 05/15/2020   BUN 12 05/15/2020   NA 137 05/15/2020   K 3.9 05/15/2020   CL 102 05/15/2020   CO2 22 05/15/2020   Lab Results  Component Value Date   ALT 26 05/15/2020   AST 27 05/15/2020   ALKPHOS 83 05/15/2020   BILITOT 0.3 05/15/2020   Lab Results  Component Value Date   HGBA1C 5.3 02/01/2020   Lab Results  Component Value Date   INSULIN 21.5 01/20/2022   Lab Results  Component Value Date   TSH 2.200 01/20/2022   No results found for: "CHOL", "HDL", "LDLCALC", "LDLDIRECT", "TRIG", "CHOLHDL" Lab Results  Component Value Date   WBC 9.0 05/15/2020   HGB 14.2 05/15/2020   HCT 44.3 05/15/2020   MCV 87.4 05/15/2020   PLT 382 05/15/2020   No results found for: "IRON", "TIBC", "FERRITIN" Lab Results  Component Value Date   VD25OH 19.0 (L) 01/20/2022    Attestation Statements:   Reviewed by clinician on day of visit: allergies, medications, problem list, medical history, surgical history, family history, social history, and previous encounter notes.  ***(delete if time-based billing not used) Time spent on visit including the items listed below was *** minutes.  -preparing to see the patient (e.g., review of tests, history, previous notes) -obtaining and/or reviewing separately obtained history -counseling and educating the patient/family/caregiver -documenting clinical information in the electronic or other health record -ordering medications, tests, or procedures -independently interpreting results and communicating results to the patient/ family/caregiver -referring and communicating with other health care professionals  -care coordination

## 2022-06-11 ENCOUNTER — Telehealth (INDEPENDENT_AMBULATORY_CARE_PROVIDER_SITE_OTHER): Payer: 59 | Admitting: Family Medicine

## 2022-06-11 ENCOUNTER — Encounter (INDEPENDENT_AMBULATORY_CARE_PROVIDER_SITE_OTHER): Payer: Self-pay | Admitting: Family Medicine

## 2022-06-11 DIAGNOSIS — E669 Obesity, unspecified: Secondary | ICD-10-CM | POA: Diagnosis not present

## 2022-06-11 DIAGNOSIS — Z6833 Body mass index (BMI) 33.0-33.9, adult: Secondary | ICD-10-CM | POA: Diagnosis not present

## 2022-06-11 DIAGNOSIS — F39 Unspecified mood [affective] disorder: Secondary | ICD-10-CM

## 2022-06-24 ENCOUNTER — Other Ambulatory Visit (HOSPITAL_COMMUNITY): Payer: Self-pay

## 2022-06-24 ENCOUNTER — Telehealth (INDEPENDENT_AMBULATORY_CARE_PROVIDER_SITE_OTHER): Payer: Self-pay | Admitting: Family Medicine

## 2022-06-24 ENCOUNTER — Other Ambulatory Visit (HOSPITAL_BASED_OUTPATIENT_CLINIC_OR_DEPARTMENT_OTHER): Payer: Self-pay

## 2022-06-24 NOTE — Telephone Encounter (Signed)
Patient stated her pharmacy has sent over the PA and it has not been completed. Wegovy 0.25. Can you please call patient to confirm it has been completed. Thanks!

## 2022-06-24 NOTE — Telephone Encounter (Signed)
Prior authorization done via cover my meds for patients Wegovy 0.25 ML. Waiting on determination. Charlies Constable ([employer]) Mancel Parsons 0.25MG /0.5ML auto-injectors

## 2022-06-24 NOTE — Telephone Encounter (Signed)
Prior authorization approved for her 76. Patient is notified. She will call back if there are any problems or concerns.

## 2022-06-25 ENCOUNTER — Encounter (INDEPENDENT_AMBULATORY_CARE_PROVIDER_SITE_OTHER): Payer: Self-pay | Admitting: Family Medicine

## 2022-06-25 ENCOUNTER — Ambulatory Visit (INDEPENDENT_AMBULATORY_CARE_PROVIDER_SITE_OTHER): Payer: 59 | Admitting: Family Medicine

## 2022-06-25 VITALS — BP 129/83 | HR 97 | Temp 98.1°F | Ht 68.0 in | Wt 222.0 lb

## 2022-06-25 DIAGNOSIS — E88819 Insulin resistance, unspecified: Secondary | ICD-10-CM

## 2022-06-25 DIAGNOSIS — E559 Vitamin D deficiency, unspecified: Secondary | ICD-10-CM

## 2022-06-25 DIAGNOSIS — E669 Obesity, unspecified: Secondary | ICD-10-CM

## 2022-06-25 DIAGNOSIS — Z6833 Body mass index (BMI) 33.0-33.9, adult: Secondary | ICD-10-CM

## 2022-06-25 DIAGNOSIS — F39 Unspecified mood [affective] disorder: Secondary | ICD-10-CM

## 2022-06-25 DIAGNOSIS — R7303 Prediabetes: Secondary | ICD-10-CM | POA: Diagnosis not present

## 2022-06-25 MED ORDER — VITAMIN D (ERGOCALCIFEROL) 1.25 MG (50000 UNIT) PO CAPS: 50000.0000 [IU] | ORAL_CAPSULE | ORAL | 0 refills | Status: DC

## 2022-06-26 ENCOUNTER — Other Ambulatory Visit (HOSPITAL_COMMUNITY): Payer: Self-pay

## 2022-06-26 LAB — HEMOGLOBIN A1C
Est. average glucose Bld gHb Est-mCnc: 114 mg/dL
Hgb A1c MFr Bld: 5.6 % (ref 4.8–5.6)

## 2022-06-26 LAB — VITAMIN D 25 HYDROXY (VIT D DEFICIENCY, FRACTURES): Vit D, 25-Hydroxy: 26.9 ng/mL — ABNORMAL LOW (ref 30.0–100.0)

## 2022-06-26 LAB — INSULIN, RANDOM: INSULIN: 12.3 u[IU]/mL (ref 2.6–24.9)

## 2022-06-30 ENCOUNTER — Other Ambulatory Visit (HOSPITAL_COMMUNITY): Payer: Self-pay

## 2022-07-01 ENCOUNTER — Other Ambulatory Visit (HOSPITAL_COMMUNITY): Payer: Self-pay

## 2022-07-14 NOTE — Progress Notes (Signed)
Chief Complaint:   OBESITY Annette Blevins is here to discuss her progress with her obesity treatment plan along with follow-up of her obesity related diagnoses. Annette Blevins is on the Category 4 Plan+100 protein and states she is following her eating plan approximately 75% of the time. Annette Blevins states she is swimming 60 minutes 3-4 times per week.  Today's visit was #: 7 Starting weight: 227 lbs Starting date: 01/20/2022 Today's weight: 222 lbs Today's date: 06/25/2022 Total lbs lost to date: 5 lbs Total lbs lost since last in-office visit: +2 lbs  Interim History: hunger has been worse with increased exercise.  Patient started Lamictal with a new psychiatrist.  Patient is moving to [place] this summer.  Using [employer] pickup for groceries.  Typically order food out 3 nights and then over snack.  She has been avoiding SSB's.  She occasionally uses THC.  Subjective:   1. Vitamin D deficiency Vitamin D level 19 on 01/20/2022.  Patient is taking prescription vitamin D 50,000 IU weekly, energy level improving.  2. Insulin resistance Fasting insulin 21.5 on 01/20/2022.  Patient is working on reducing sugar and increasing exercise.  3. Pre-diabetes Patient has never been on metformin, but has a history of PCOS.  4. Mood disorder [facility]) Patient sees psychiatry, mood has been more unstable but recently started Lamictal.  Assessment/Plan:   1. Vitamin D deficiency Check vitamin D level today.  Refill- Vitamin D, Ergocalciferol, (DRISDOL) 1.25 MG (50000 UNIT) CAPS capsule; Take 1 capsule (50,000 Units total) by mouth every 7 (seven) days.  Dispense: 5 capsule; Refill: 0  - VITAMIN D 25 Hydroxy (Vit-D Deficiency, Fractures)  2. Insulin resistance Check labs today.  Continue prescribed diet.  - Insulin, random - Hemoglobin A1c  3. Pre-diabetes Increase walking time and check labs today.  - Insulin, random - Hemoglobin A1c  4. Mood disorder ([place]) Encouraged THC cessation.  Follow-up with  psychiatry  5. Generalized obesity  6. Obesity,current BMI 33.9 1.  Add in a protein/carb snack after working out. 2.  Reviewed easy and healthy lunch and dinner options.  Annette Blevins is currently in the action stage of change. As such, her goal is to continue with weight loss efforts. She has agreed to the Category 4 Plan.   Exercise goals: All adults should avoid inactivity. Some physical activity is better than none, and adults who participate in any amount of physical activity gain some health benefits.  Behavioral modification strategies: increasing lean protein intake, increasing vegetables, increasing water intake, decreasing eating out, no skipping meals, meal planning and cooking strategies, keeping healthy foods in the home, avoiding temptations, and planning for success.  Annette Blevins has agreed to follow-up with our clinic in 2-3 weeks. She was informed of the importance of frequent follow-up visits to maximize her success with intensive lifestyle modifications for her multiple health conditions.   Objective:   Blood pressure 129/83, pulse 97, temperature 98.1 F (36.7 C), height '5\' 8"'$  (1.727 m), weight 222 lb (100.7 kg), SpO2 97 %. Body mass index is 33.75 kg/m.  General: Cooperative, alert, well developed, in no acute distress. HEENT: Conjunctivae and lids unremarkable. Cardiovascular: Regular rhythm.  Lungs: Normal work of breathing. Neurologic: No focal deficits.   Lab Results  Component Value Date   CREATININE 0.80 05/15/2020   BUN 12 05/15/2020   NA 137 05/15/2020   K 3.9 05/15/2020   CL 102 05/15/2020   CO2 22 05/15/2020   Lab Results  Component Value Date   ALT 26 05/15/2020  AST 27 05/15/2020   ALKPHOS 83 05/15/2020   BILITOT 0.3 05/15/2020   Lab Results  Component Value Date   HGBA1C 5.6 06/25/2022   HGBA1C 5.3 02/01/2020   Lab Results  Component Value Date   INSULIN 12.3 06/25/2022   INSULIN 21.5 01/20/2022   Lab Results  Component Value Date   TSH  2.200 01/20/2022   No results found for: "CHOL", "HDL", "LDLCALC", "LDLDIRECT", "TRIG", "CHOLHDL" Lab Results  Component Value Date   VD25OH 26.9 (L) 06/25/2022   VD25OH 19.0 (L) 01/20/2022   Lab Results  Component Value Date   WBC 9.0 05/15/2020   HGB 14.2 05/15/2020   HCT 44.3 05/15/2020   MCV 87.4 05/15/2020   PLT 382 05/15/2020   No results found for: "IRON", "TIBC", "FERRITIN"  Attestation Statements:   Reviewed by clinician on day of visit: allergies, medications, problem list, medical history, surgical history, family history, social history, and previous encounter notes.  I, Davy Pique, am acting as Location manager for Loyal Gambler, DO.  I have reviewed the above documentation for accuracy and completeness, and I agree with the above. Dell Ponto, DO

## 2022-07-15 NOTE — Progress Notes (Unsigned)
  TeleHealth Visit:  This visit was completed with telemedicine (audio/video) technology. Annette Blevins has verbally consented to this TeleHealth visit. The patient is located at home, the provider is located at home. The participants in this visit include the listed provider and patient. The visit was conducted today via MyChart video.  OBESITY Annette Blevins is here to discuss her progress with her obesity treatment plan along with follow-up of her obesity related diagnoses.   Today's visit was # 8 Starting weight: 227 lbs Starting date: 01/20/2022 Weight at last in office visit: 222 lbs on 06/25/22 Total weight loss: 5 lbs at last in office visit on 06/25/22. Today's reported weight: *** lbs No weight reported.  Nutrition Plan: the Category 4 plan.  Current exercise: {exercise types:16438} swimming 60 minutes 3-4 times per week  Interim History:  ***  Pharmacotherapy: Annette Blevins is on {dwwpharmacotherapy:29109} Adverse side effects: {dwwse:29122} Hunger is {EWCONTROLASSESSMENT:24261}.  Cravings are {EWCONTROLASSESSMENT:24261}.  Assessment/Plan:  1. ***  2. ***  3. ***  {dwwmorbid:29108::"Morbid Obesity"}: Current BMI *** Pharmacotherapy Plan {dwwmed:29123}  {dwwpharmacotherapy:29109} Annette Blevins {CHL AMB IS/IS NOT:210130109} currently in the action stage of change. As such, her goal is to {MWMwtloss#1:210800005}.  She has agreed to {dwwsldiets:29085}.  Exercise goals: {MWM EXERCISE RECS:23473}  Behavioral modification strategies: {dwwslwtlossstrategies:29088}.  Annette Blevins has agreed to follow-up with our clinic in {NUMBER 1-10:22536} weeks.   No orders of the defined types were placed in this encounter.   There are no discontinued medications.   No orders of the defined types were placed in this encounter.     Objective:   VITALS: Per patient if applicable, see vitals. GENERAL: Alert and in no acute distress. CARDIOPULMONARY: No increased WOB. Speaking in clear sentences.  PSYCH:  Pleasant and cooperative. Speech normal rate and rhythm. Affect is appropriate. Insight and judgement are appropriate. Attention is focused, linear, and appropriate.  NEURO: Oriented as arrived to appointment on time with no prompting.   Attestation Statements:   Reviewed by clinician on day of visit: allergies, medications, problem list, medical history, surgical history, family history, social history, and previous encounter notes.  ***(delete if time-based billing not used) Time spent on visit including the items listed below was *** minutes.  -preparing to see the patient (e.g., review of tests, history, previous notes) -obtaining and/or reviewing separately obtained history -counseling and educating the patient/family/caregiver -documenting clinical information in the electronic or other health record -ordering medications, tests, or procedures -independently interpreting results and communicating results to the patient/ family/caregiver -referring and communicating with other health care professionals  -care coordination   This was prepared with the assistance of Presenter, broadcasting.  Occasional wrong-word or sound-a-like substitutions may have occurred due to the inherent limitations of voice recognition software.

## 2022-07-16 ENCOUNTER — Telehealth (INDEPENDENT_AMBULATORY_CARE_PROVIDER_SITE_OTHER): Payer: 59 | Admitting: Family Medicine

## 2022-07-16 ENCOUNTER — Encounter (INDEPENDENT_AMBULATORY_CARE_PROVIDER_SITE_OTHER): Payer: Self-pay | Admitting: Family Medicine

## 2022-07-16 ENCOUNTER — Other Ambulatory Visit (HOSPITAL_COMMUNITY): Payer: Self-pay

## 2022-07-16 DIAGNOSIS — E669 Obesity, unspecified: Secondary | ICD-10-CM | POA: Diagnosis not present

## 2022-07-16 DIAGNOSIS — F5089 Other specified eating disorder: Secondary | ICD-10-CM

## 2022-07-16 DIAGNOSIS — Z6833 Body mass index (BMI) 33.0-33.9, adult: Secondary | ICD-10-CM

## 2022-07-16 DIAGNOSIS — F39 Unspecified mood [affective] disorder: Secondary | ICD-10-CM | POA: Diagnosis not present

## 2022-07-16 DIAGNOSIS — Z6834 Body mass index (BMI) 34.0-34.9, adult: Secondary | ICD-10-CM | POA: Diagnosis not present

## 2022-07-16 MED ORDER — WEGOVY 0.5 MG/0.5ML ~~LOC~~ SOAJ
0.5000 mg | SUBCUTANEOUS | 0 refills | Status: DC
  Filled 2022-07-16: qty 2, 28d supply, fill #0

## 2022-07-19 ENCOUNTER — Other Ambulatory Visit (INDEPENDENT_AMBULATORY_CARE_PROVIDER_SITE_OTHER): Payer: Self-pay | Admitting: Family Medicine

## 2022-07-19 DIAGNOSIS — E559 Vitamin D deficiency, unspecified: Secondary | ICD-10-CM

## 2022-07-28 ENCOUNTER — Other Ambulatory Visit (HOSPITAL_COMMUNITY): Payer: Self-pay

## 2022-07-30 ENCOUNTER — Other Ambulatory Visit (HOSPITAL_COMMUNITY): Payer: Self-pay

## 2022-07-30 ENCOUNTER — Ambulatory Visit (INDEPENDENT_AMBULATORY_CARE_PROVIDER_SITE_OTHER): Payer: 59 | Admitting: Family Medicine

## 2022-07-30 ENCOUNTER — Encounter (INDEPENDENT_AMBULATORY_CARE_PROVIDER_SITE_OTHER): Payer: Self-pay | Admitting: Family Medicine

## 2022-07-30 VITALS — BP 113/76 | HR 74 | Temp 97.4°F | Ht 68.0 in | Wt 219.0 lb

## 2022-07-30 DIAGNOSIS — Z6834 Body mass index (BMI) 34.0-34.9, adult: Secondary | ICD-10-CM

## 2022-07-30 DIAGNOSIS — E559 Vitamin D deficiency, unspecified: Secondary | ICD-10-CM | POA: Diagnosis not present

## 2022-07-30 DIAGNOSIS — E669 Obesity, unspecified: Secondary | ICD-10-CM

## 2022-07-30 DIAGNOSIS — R632 Polyphagia: Secondary | ICD-10-CM | POA: Diagnosis not present

## 2022-07-30 DIAGNOSIS — R7303 Prediabetes: Secondary | ICD-10-CM | POA: Diagnosis not present

## 2022-07-30 DIAGNOSIS — Z6833 Body mass index (BMI) 33.0-33.9, adult: Secondary | ICD-10-CM

## 2022-07-30 MED ORDER — WEGOVY 1 MG/0.5ML ~~LOC~~ SOAJ
1.0000 mg | SUBCUTANEOUS | 0 refills | Status: DC
  Filled 2022-07-30: qty 2, 28d supply, fill #0

## 2022-07-30 MED ORDER — VITAMIN D (ERGOCALCIFEROL) 1.25 MG (50000 UNIT) PO CAPS: 50000.0000 [IU] | ORAL_CAPSULE | ORAL | 0 refills | Status: DC

## 2022-07-30 NOTE — Assessment & Plan Note (Signed)
Lab Results  Component Value Date   HGBA1C 5.6 06/25/2022   She is doing well with A1c reduction with reduced sugar intake.  Plan to:  Swap out high sugar snacks for lower sugar options- she has started to do this. Increase exercise time to 30-45 min 3 x a week.

## 2022-07-30 NOTE — Assessment & Plan Note (Signed)
Last vitamin D Lab Results  Component Value Date   VD25OH 26.9 (L) 06/25/2022   Doing well on RX vitamin D 50,000 IU weekly.  Energy level starting to improve.  Denies GI side effects  Continue RX vitamin D 50,000 IU weekly Recheck level in 3 mos

## 2022-07-30 NOTE — Progress Notes (Signed)
Office: [phone]  /  Fax: [place]  Starting Date: 01/20/22  Starting Weight: 227lb   Weight Lost Since Last Visit: 3lb   Vitals Temp: (!) 97.4 F (36.3 C) BP: 113/76 Pulse Rate: 74 SpO2: 99 %   Body Composition  Body Fat %: 40.8 % Fat Mass (lbs): 89.4 lbs Muscle Mass (lbs): 123 lbs Total Body Water (lbs): 86.8 lbs Visceral Fat Rating : 7   HPI  Chief Complaint: OBESITY  Annette Blevins is here to discuss her progress with her obesity treatment plan. She is on the the Category 4 Plan and states she is following her eating plan approximately 95 % of the time. She states she is exercising 120 minutes 1 times per week.   Interval History:  Since last office visit she is down 3 lb since last visit This gives her a net weight loss of 8 lb since starting our program in Sept She is eating off plan but is making better choices She is cooking more at home She has a good support system She has increased her water in - 56- 100 oz She has tried out [employer] protein bars, [employer] PB cups She tends to crave more sweets around her menses  She did increase Wegovy to 0.5 mg weekly one week ago She had some GI upset after having a handful of chocolate eggs.  Pharmacotherapy: [id number]  PHYSICAL EXAM:  Blood pressure 113/76, pulse 74, temperature (!) 97.4 F (36.3 C), height '5\' 8"'$  (1.727 m), weight 219 lb (99.3 kg), SpO2 99 %. Body mass index is 33.3 kg/m.  General: She is overweight, cooperative, alert, well developed, and in no acute distress. PSYCH: Has normal mood, affect and thought process.   Lungs: Normal breathing effort, no conversational dyspnea.   ASSESSMENT AND PLAN  TREATMENT PLAN FOR OBESITY:  Recommended Dietary Goals  Hollan is currently in the action stage of change. As such, her goal is to continue weight management plan. She has agreed to practicing portion control and making smarter food choices, such as increasing  vegetables and decreasing simple carbohydrates.  Behavioral Intervention  We discussed the following Behavioral Modification Strategies today: increasing lean protein intake, increasing vegetables, avoiding skipping meals, increasing water intake, work on meal planning and easy cooking plans, work on managing stress, creating time for self-care and relaxation measures, and avoiding temptations and identifying enticing environmental cues.  Additional resources provided today: NA  Recommended Physical Activity Goals  Braidyn has been advised to work up to 150 minutes of moderate intensity aerobic activity a week and strengthening exercises 2-3 times per week for cardiovascular health, weight loss maintenance and preservation of muscle mass.   She has agreed to Work on scheduling and tracking physical activity.   Pharmacotherapy changes for the treatment of obesity:   ASSOCIATED CONDITIONS ADDRESSED TODAY  Vitamin D deficiency Assessment & Plan: Last vitamin D Lab Results  Component Value Date   VD25OH 26.9 (L) 06/25/2022   Doing well on RX vitamin D 50,000 IU weekly.  Energy level starting to improve.  Denies GI side effects  Continue RX vitamin D 50,000 IU weekly Recheck level in 3 mos  Orders: -     Vitamin D (Ergocalciferol); Take 1 capsule (50,000 Units total) by mouth every 7 (seven) days.  Dispense: 12 capsule; Refill: 0  Generalized obesity: Starting BMI 34.5 -     Wegovy; Inject 1 mg into the skin once a week.  Dispense: 2 mL; Refill: 0  BMI 33.0-33.9,adult  Pre-diabetes Assessment & Plan: Lab Results  Component Value Date   HGBA1C 5.6 06/25/2022   She is doing well with A1c reduction with reduced sugar intake.  Plan to:  Swap out high sugar snacks for lower sugar options- she has started to do this. Increase exercise time to 30-45 min 3 x a week.   Polyphagia Assessment & Plan: Improving on Wegovy.  Finish out [employer] 0.5 mg weekly x 4 weeks then increase to  1 mg weekly. Call if any problems or questions.  Continue to work on eating on a schedule, meal planning and protein intake.  Orders: -     Wegovy; Inject 1 mg into the skin once a week.  Dispense: 2 mL; Refill: 0      She was informed of the importance of frequent follow up visits to maximize her success with intensive lifestyle modifications for her multiple health conditions.   ATTESTASTION STATEMENTS:  Reviewed by clinician on day of visit: allergies, medications, problem list, medical history, surgical history, family history, social history, and previous encounter notes pertinent to obesity diagnosis.   I have personally spent 30 minutes total time today in preparation, patient care, nutritional counseling and documentation for this visit, including the following: review of clinical lab tests; review of medical tests/procedures/services.      Dell Ponto, DO DABFM, DABOM [facility] [address], [place] [address] [phone]

## 2022-07-30 NOTE — Assessment & Plan Note (Signed)
Improving on Wegovy.  Finish out [employer] 0.5 mg weekly x 4 weeks then increase to 1 mg weekly. Call if any problems or questions.  Continue to work on eating on a schedule, meal planning and protein intake.

## 2022-08-20 NOTE — Progress Notes (Signed)
TeleHealth Visit:  This visit was completed with telemedicine (audio/video) technology. Annette Blevins has verbally consented to this TeleHealth visit. The patient is located at home, the provider is located at home. The participants in this visit include the listed provider and patient. The visit was conducted today via MyChart video.  OBESITY Annette Blevins is here to discuss her progress with her obesity treatment plan along with follow-up of her obesity related diagnoses.   Today's visit was # 10 Starting weight: 227 lbs Starting date: 9/5/254 Weight at last in office visit: 219 lbs on 07/30/22 Total weight loss: 8 lbs at last in office visit on 07/30/22. Today's reported weight (08/24/22):  214 lbs  Nutrition Plan: practicing portion control and making smarter food choices, such as increasing vegetables and decreasing simple carbohydrates   Current exercise:  exercising 20 minutes a few times per week.  Interim History:  She feels that her eating pattern is better than it has been in a long time.  She has lost 5 pounds since her last visit according to her home scales. She is having regular meals and protein at every meal.  She sometimes substitutes a 20 g protein bar when she does not feel like eating a meal. She reports no binging behavior.  She is unsure if it is the [facility] or combination of Lamictal and Wegovy.  She has a history of binging and purging. She is getting in 1 to 2 cups of vegetables every day.  Recently found out she does not have to move to [place].  Pharmacotherapy: Annette Blevins is on Wegovy 0.50 mg SQ weekly.  She will start the 1 mg dose with her next injection. Adverse side effects: None Hunger is well controlled.  Cravings are well controlled.  Assessment/Plan:  1. Eating disorder/emotional eating She follows up with psychiatry.  Lamictal dose is now titrated up to 200 mg daily.  She is still on sertraline 50 mg daily and notes increased anxiety when she has  tried to go off of it in the past. She has not had any binging. Currently this is well controlled. Overall mood is stable.  Plan: Continue medications at current dosages.   Follow-up with psychiatry as directed.  2. Vitamin D Deficiency Vitamin D is not at goal of 50.  Most recent vitamin D level was 26.9. She is on  prescription ergocalciferol 50,000 IU weekly. Lab Results  Component Value Date   VD25OH 26.9 (L) 06/25/2022   VD25OH 19.0 (L) 01/20/2022    Plan: Continue and refill  prescription ergocalciferol 50,000 IU weekly   3. Generalized Obesity: Current BMI 33 Pharmacotherapy Plan Start  Wegovy 1.0 mg SQ weekly Annette Blevins is currently in the action stage of change. As such, her goal is to continue with weight loss efforts.  She has agreed to practicing portion control and making smarter food choices, such as increasing vegetables and decreasing simple carbohydrates.  Exercise goals: 30 minutes of exercise 3 times per week.  Behavioral modification strategies: increasing lean protein intake, decreasing simple carbohydrates , meal planning , and planning for success.  Annette Blevins has agreed to follow-up with our clinic in 3 weeks.   No orders of the defined types were placed in this encounter.   Medications Discontinued During This Encounter  Medication Reason   Vitamin D, Ergocalciferol, (DRISDOL) 1.25 MG (50000 UNIT) CAPS capsule Reorder     Meds ordered this encounter  Medications   Vitamin D, Ergocalciferol, (DRISDOL) 1.25 MG (50000 UNIT) CAPS capsule    Sig:  Take 1 capsule (50,000 Units total) by mouth every 7 (seven) days.    Dispense:  5 capsule    Refill:  0    Requesting 1 year supply    Order Specific Question:   Supervising Provider    Answer:   Glennis BrinkBOWEN, KAREN E [[id number]]      Objective:   VITALS: Per patient if applicable, see vitals. GENERAL: Alert and in no acute distress. CARDIOPULMONARY: No increased WOB. Speaking in clear sentences.  PSYCH: Pleasant and  cooperative. Speech normal rate and rhythm. Affect is appropriate. Insight and judgement are appropriate. Attention is focused, linear, and appropriate.  NEURO: Oriented as arrived to appointment on time with no prompting.   Attestation Statements:   Reviewed by clinician on day of visit: allergies, medications, problem list, medical history, surgical history, family history, social history, and previous encounter notes.   This was prepared with the assistance of Engineer, civil (consulting)[employer].  Occasional wrong-word or sound-a-like substitutions may have occurred due to the inherent limitations of voice recognition software.

## 2022-08-24 ENCOUNTER — Telehealth (INDEPENDENT_AMBULATORY_CARE_PROVIDER_SITE_OTHER): Payer: 59 | Admitting: Family Medicine

## 2022-08-24 ENCOUNTER — Encounter (INDEPENDENT_AMBULATORY_CARE_PROVIDER_SITE_OTHER): Payer: Self-pay | Admitting: Family Medicine

## 2022-08-24 DIAGNOSIS — E559 Vitamin D deficiency, unspecified: Secondary | ICD-10-CM

## 2022-08-24 DIAGNOSIS — E669 Obesity, unspecified: Secondary | ICD-10-CM | POA: Diagnosis not present

## 2022-08-24 DIAGNOSIS — Z6833 Body mass index (BMI) 33.0-33.9, adult: Secondary | ICD-10-CM | POA: Diagnosis not present

## 2022-08-24 DIAGNOSIS — F5089 Other specified eating disorder: Secondary | ICD-10-CM

## 2022-08-24 MED ORDER — VITAMIN D (ERGOCALCIFEROL) 1.25 MG (50000 UNIT) PO CAPS: 50000.0000 [IU] | ORAL_CAPSULE | ORAL | 0 refills | Status: DC

## 2022-09-15 ENCOUNTER — Other Ambulatory Visit (HOSPITAL_COMMUNITY): Payer: Self-pay

## 2022-09-15 ENCOUNTER — Encounter (INDEPENDENT_AMBULATORY_CARE_PROVIDER_SITE_OTHER): Payer: Self-pay | Admitting: Family Medicine

## 2022-09-15 ENCOUNTER — Ambulatory Visit (INDEPENDENT_AMBULATORY_CARE_PROVIDER_SITE_OTHER): Payer: 59 | Admitting: Family Medicine

## 2022-09-15 VITALS — BP 120/78 | HR 92 | Temp 98.3°F | Ht 68.0 in | Wt 208.0 lb

## 2022-09-15 DIAGNOSIS — R110 Nausea: Secondary | ICD-10-CM | POA: Insufficient documentation

## 2022-09-15 DIAGNOSIS — E559 Vitamin D deficiency, unspecified: Secondary | ICD-10-CM

## 2022-09-15 DIAGNOSIS — E785 Hyperlipidemia, unspecified: Secondary | ICD-10-CM | POA: Diagnosis not present

## 2022-09-15 DIAGNOSIS — E669 Obesity, unspecified: Secondary | ICD-10-CM

## 2022-09-15 DIAGNOSIS — R7303 Prediabetes: Secondary | ICD-10-CM

## 2022-09-15 DIAGNOSIS — Z6831 Body mass index (BMI) 31.0-31.9, adult: Secondary | ICD-10-CM

## 2022-09-15 MED ORDER — ONDANSETRON 8 MG PO TBDP
8.0000 mg | ORAL_TABLET | Freq: Three times a day (TID) | ORAL | 0 refills | Status: DC | PRN
  Filled 2022-09-15: qty 20, 7d supply, fill #0

## 2022-09-15 MED ORDER — VITAMIN D (ERGOCALCIFEROL) 1.25 MG (50000 UNIT) PO CAPS: 50000.0000 [IU] | ORAL_CAPSULE | ORAL | 0 refills | Status: DC

## 2022-09-15 MED ORDER — WEGOVY 1.7 MG/0.75ML ~~LOC~~ SOAJ
1.7000 mg | SUBCUTANEOUS | 0 refills | Status: DC
  Filled 2022-09-15: qty 3, 28d supply, fill #0

## 2022-09-15 NOTE — Progress Notes (Signed)
Office: [phone]  /  Fax: [phone]  WEIGHT SUMMARY AND BIOMETRICS  Starting Date: 01/20/22  Starting Weight: 227 lb   Weight Lost Since Last Visit: 11 lb   Vitals Temp: 98.3 F (36.8 C) BP: 120/78 Pulse Rate: 92 SpO2: 98 %   Body Composition  Body Fat %: 39.1 % Fat Mass (lbs): 81.4 lbs Muscle Mass (lbs): 120.6 lbs Total Body Water (lbs): 84 lbs Visceral Fat Rating : 6   HPI  Chief Complaint: OBESITY  Annette Blevins is here to discuss her progress with her obesity treatment plan. She is on the practicing portion control and making smarter food choices, such as increasing vegetables and decreasing simple carbohydrates and states she is following her eating plan approximately 95 % of the time. She states she is exercising/walking 30 minutes 2-3 times per week.   Interval History:  Since last office visit she is down 11 lb  She lost 8 lb of body fat She is mostly on Cat 4 meal plan and her friend is also working on weight loss She likes Wegovy 1 mg weekly for improved satiety.  Denies meal skipping. She had one day of N/V when she first increased her Wegovy dose She plans to walk once she she moves in the next week   Pharmacotherapy: Wegovy 1 mg weekly  PHYSICAL EXAM:  Blood pressure 120/78, pulse 92, temperature 98.3 F (36.8 C), height 5\' 8"  (1.727 m), weight 208 lb (94.3 kg), SpO2 98 %. Body mass index is 31.63 kg/m.  General: She is overweight, cooperative, alert, well developed, and in no acute distress. PSYCH: Has normal mood, affect and thought process.   Lungs: Normal breathing effort, no conversational dyspnea.   ASSESSMENT AND PLAN  TREATMENT PLAN FOR OBESITY:  Recommended Dietary Goals  Annette Blevins is currently in the action stage of change. As such, her goal is to continue weight management plan. She has agreed to the Category 4 Plan.  Behavioral Intervention  We discussed the following Behavioral Modification Strategies today: increasing lean  protein intake, decreasing simple carbohydrates , increasing vegetables, increasing lower glycemic fruits, avoiding skipping meals, increasing water intake, work on managing stress, creating time for self-care and relaxation measures, continue to practice mindfulness when eating, and planning for success.  Additional resources provided today: NA  Recommended Physical Activity Goals  Annette Blevins has been advised to work up to 150 minutes of moderate intensity aerobic activity a week and strengthening exercises 2-3 times per week for cardiovascular health, weight loss maintenance and preservation of muscle mass.   She has agreed to Start aerobic activity with a goal of 150 minutes a week at moderate intensity.   Pharmacotherapy changes for the treatment of obesity: Increase Wegovy to 1.7 mg weekly  ASSOCIATED CONDITIONS ADDRESSED TODAY  Nausea Assessment & Plan: Due to use of Wegovy, occurring 1 day following injection  RX for Zofran 8 mg to use q 8 hrs prn given.  Orders: -     Ondansetron; Take 1 tablet (8 mg total) by mouth every 8 (eight) hours as needed for nausea or vomiting.  Dispense: 20 tablet; Refill: 0  Vitamin D deficiency Assessment & Plan: Last vitamin D Lab Results  Component Value Date   VD25OH 26.9 (L) 06/25/2022   She is doing well on RX vitamin D 50,000 IU once weekly with a goal 50-70.  Energy level is improving.    Continue RX vitamin D 50,000 IU weekly.  Recheck level in 2 mois  Orders: -  Vitamin D (Ergocalciferol); Take 1 capsule (50,000 Units total) by mouth every 7 (seven) days.  Dispense: 5 capsule; Refill: 0  Generalized obesity: Starting BMI 34.5 -     Wegovy; Inject 1.7 mg into the skin once a week.  Dispense: 3 mL; Refill: 0  BMI 31.0-31.9,adult  Pre-diabetes Assessment & Plan: Lab Results  Component Value Date   HGBA1C 5.6 06/25/2022   She has seen improvements in prediabetes with A1c reduction following implementation of a low sugar/ lower  starch diet rich in lean protein and fiber.  She plans to increase walking time.  Continue current changes.  Add in 30 min of walking 3+ days/ wk.   Dyslipidemia Assessment & Plan: She has previously had findings of dyslipidemia on labs from last summer.  She is not on any lipid lowering medications.  She has lost 19 lb in the past 7 mos and is working on a low saturated fat diet.  Recheck FLP with labs in the next 2 mos       She was informed of the importance of frequent follow up visits to maximize her success with intensive lifestyle modifications for her multiple health conditions.   ATTESTASTION STATEMENTS:  Reviewed by clinician on day of visit: allergies, medications, problem list, medical history, surgical history, family history, social history, and previous encounter notes pertinent to obesity diagnosis.   I have personally spent 30 minutes total time today in preparation, patient care, nutritional counseling and documentation for this visit, including the following: review of clinical lab tests; review of medical tests/procedures/services.      Annette Brink, DO DABFM, DABOM [facility] [address] [place], [place] [address] [phone]

## 2022-09-15 NOTE — Assessment & Plan Note (Signed)
She has previously had findings of dyslipidemia on labs from last summer.  She is not on any lipid lowering medications.  She has lost 19 lb in the past 7 mos and is working on a low saturated fat diet.  Recheck FLP with labs in the next 2 mos

## 2022-09-15 NOTE — Assessment & Plan Note (Signed)
Due to use of Wegovy, occurring 1 day following injection  RX for Zofran 8 mg to use q 8 hrs prn given.

## 2022-09-15 NOTE — Assessment & Plan Note (Signed)
Last vitamin D Lab Results  Component Value Date   VD25OH 26.9 (L) 06/25/2022   She is doing well on RX vitamin D 50,000 IU once weekly with a goal 50-70.  Energy level is improving.    Continue RX vitamin D 50,000 IU weekly.  Recheck level in 2 mois

## 2022-09-15 NOTE — Assessment & Plan Note (Signed)
Lab Results  Component Value Date   HGBA1C 5.6 06/25/2022   She has seen improvements in prediabetes with A1c reduction following implementation of a low sugar/ lower starch diet rich in lean protein and fiber.  She plans to increase walking time.  Continue current changes.  Add in 30 min of walking 3+ days/ wk.

## 2022-09-16 ENCOUNTER — Other Ambulatory Visit (HOSPITAL_COMMUNITY): Payer: Self-pay

## 2022-10-06 NOTE — Progress Notes (Signed)
TeleHealth Visit:  This visit was completed with telemedicine (audio/video) technology. Annette Blevins has verbally consented to this TeleHealth visit. The patient is located at home, the provider is located at home. The participants in this visit include the listed provider and patient. The visit was conducted today via MyChart video.  OBESITY Annette Blevins is here to discuss her progress with her obesity treatment plan along with follow-up of her obesity related diagnoses.   Today's visit was # 11 Starting weight: 227 lbs Starting date: 01/20/22 Weight at last in office visit: 208 lbs on 09/15/22 Total weight loss: 19 lbs at last in office visit on 09/15/22. Today's reported weight (10/07/22):  201 lbs  Nutrition Plan: the Category 4 plan   Current exercise:  exercising/walking 20 minutes 2-3 times per week   Interim History:  She is doing very well with the meal plan and weight loss. She has lost about 7 pounds since her last office visit according to her home scale.  At last office visit she had lost 11 pounds since prior visit. She is excited about almost being below 200 pounds.  Has not been under 200 since 2019. Top weight 245 lbs. She had some nausea with the increased dose of Wegovy.  She believes she is getting most of the protein in.  Husband has T1DM. Has been eating plenty of fruits and vegetables.   She is walking 20 minutes a few days per week.  She moved recently and lives near the [place].  We discussed increasing this to 30 minutes 3 times a week.  She also wants to go kayaking in June.  Pharmacotherapy: Millard is on Wegovy 1.7 SQ weekly Adverse side effects: Nausea and constipation Hunger is well controlled.  Cravings are well controlled.  Assessment/Plan:  1. Constipation [place] notes constipation.   This is related to GLP-1 therapy.  She has been taking a fiber supplement without much relief. Constipation is poorly controlled.   Plan: Increase fiber and water. Add  MiraLAX once daily.  May take twice a day or every other day based on results.  2. Nausea Has had nausea related to [facility].   She finds that when she eats plenty of protein and water the day before her injection and the day of her injection nausea is minimal.  Zofran also helps.  Plan: Refill Zofran 8 mg by mouth every 8 hours as needed for nausea vomiting.  3. Generalized Obesity: Current BMI 31  Pharmacotherapy Plan Continue and refill  Wegovy 1.7 SQ weekly  Annette Blevins is currently in the action stage of change. As such, her goal is to continue with weight loss efforts.  She has agreed to the Category 4 plan.  Exercise goals: 30 minutes of exercise 3 times per week.  Behavioral modification strategies: increasing lean protein intake, meal planning , and planning for success.  Sydna has agreed to follow-up with our clinic in 4 weeks.   No orders of the defined types were placed in this encounter.   Medications Discontinued During This Encounter  Medication Reason   Semaglutide-Weight Management (WEGOVY) 1 MG/0.5ML SOAJ    Semaglutide-Weight Management (WEGOVY) 1.7 MG/0.75ML SOAJ Reorder   ondansetron (ZOFRAN-ODT) 8 MG disintegrating tablet Reorder     Meds ordered this encounter  Medications   Semaglutide-Weight Management (WEGOVY) 1.7 MG/0.75ML SOAJ    Sig: Inject 1.7 mg into the skin once a week.    Dispense:  3 mL    Refill:  0    Order Specific Question:  Supervising Provider    Answer:   [name] [[id number]]   ondansetron (ZOFRAN-ODT) 8 MG disintegrating tablet    Sig: Take 1 tablet (8 mg total) by mouth every 8 (eight) hours as needed for nausea or vomiting.    Dispense:  20 tablet    Refill:  0    Order Specific Question:   Supervising Provider    Answer:   [name] [[id number]]      Objective:   VITALS: Per patient if applicable, see vitals. GENERAL: Alert and in no acute distress. CARDIOPULMONARY: No increased WOB. Speaking in clear sentences.  PSYCH:  Pleasant and cooperative. Speech normal rate and rhythm. Affect is appropriate. Insight and judgement are appropriate. Attention is focused, linear, and appropriate.  NEURO: Oriented as arrived to appointment on time with no prompting.   Attestation Statements:   Reviewed by clinician on day of visit: allergies, medications, problem list, medical history, surgical history, family history, social history, and previous encounter notes.   This was prepared with the assistance of Engineer, civil (consulting).  Occasional wrong-word or sound-a-like substitutions may have occurred due to the inherent limitations of voice recognition software.

## 2022-10-07 ENCOUNTER — Other Ambulatory Visit (HOSPITAL_COMMUNITY): Payer: Self-pay

## 2022-10-07 ENCOUNTER — Encounter (INDEPENDENT_AMBULATORY_CARE_PROVIDER_SITE_OTHER): Payer: Self-pay | Admitting: Family Medicine

## 2022-10-07 ENCOUNTER — Telehealth (INDEPENDENT_AMBULATORY_CARE_PROVIDER_SITE_OTHER): Payer: 59 | Admitting: Family Medicine

## 2022-10-07 DIAGNOSIS — K5909 Other constipation: Secondary | ICD-10-CM

## 2022-10-07 DIAGNOSIS — Z6831 Body mass index (BMI) 31.0-31.9, adult: Secondary | ICD-10-CM | POA: Diagnosis not present

## 2022-10-07 DIAGNOSIS — E669 Obesity, unspecified: Secondary | ICD-10-CM

## 2022-10-07 DIAGNOSIS — R110 Nausea: Secondary | ICD-10-CM

## 2022-10-07 MED ORDER — WEGOVY 1.7 MG/0.75ML ~~LOC~~ SOAJ
1.7000 mg | SUBCUTANEOUS | 0 refills | Status: DC
Start: 2022-10-07 — End: 2022-11-02
  Filled 2022-10-07 – 2022-10-10 (×2): qty 3, 28d supply, fill #0

## 2022-10-07 MED ORDER — ONDANSETRON 8 MG PO TBDP
8.0000 mg | ORAL_TABLET | Freq: Three times a day (TID) | ORAL | 0 refills | Status: DC | PRN
Start: 2022-10-07 — End: 2022-12-31
  Filled 2022-10-07: qty 20, 7d supply, fill #0

## 2022-10-08 ENCOUNTER — Other Ambulatory Visit (HOSPITAL_COMMUNITY): Payer: Self-pay

## 2022-10-10 ENCOUNTER — Other Ambulatory Visit (HOSPITAL_COMMUNITY): Payer: Self-pay

## 2022-10-14 ENCOUNTER — Telehealth: Payer: Self-pay

## 2022-10-14 ENCOUNTER — Other Ambulatory Visit (HOSPITAL_COMMUNITY): Payer: Self-pay

## 2022-10-14 NOTE — Telephone Encounter (Signed)
PA submitted through Cover My Meds for [facility]. Awaiting insurance determination. Key: [employer]

## 2022-10-14 NOTE — Telephone Encounter (Signed)
Received fax from [employer] that Reginal Lutes was previously approved. Effective dates: 06/24/22-01/23/23.

## 2022-10-15 ENCOUNTER — Other Ambulatory Visit (HOSPITAL_COMMUNITY): Payer: Self-pay

## 2022-10-16 ENCOUNTER — Other Ambulatory Visit (HOSPITAL_COMMUNITY): Payer: Self-pay

## 2022-10-16 ENCOUNTER — Encounter (HOSPITAL_COMMUNITY): Payer: Self-pay

## 2022-10-19 ENCOUNTER — Other Ambulatory Visit (HOSPITAL_COMMUNITY): Payer: Self-pay

## 2022-10-20 ENCOUNTER — Other Ambulatory Visit (HOSPITAL_COMMUNITY): Payer: Self-pay

## 2022-11-02 ENCOUNTER — Ambulatory Visit (INDEPENDENT_AMBULATORY_CARE_PROVIDER_SITE_OTHER): Payer: 59 | Admitting: Family Medicine

## 2022-11-02 ENCOUNTER — Other Ambulatory Visit (HOSPITAL_COMMUNITY): Payer: Self-pay

## 2022-11-02 ENCOUNTER — Encounter (INDEPENDENT_AMBULATORY_CARE_PROVIDER_SITE_OTHER): Payer: Self-pay | Admitting: Family Medicine

## 2022-11-02 VITALS — BP 117/76 | HR 81 | Temp 98.8°F | Ht 68.0 in | Wt 195.0 lb

## 2022-11-02 DIAGNOSIS — E559 Vitamin D deficiency, unspecified: Secondary | ICD-10-CM

## 2022-11-02 DIAGNOSIS — E88819 Insulin resistance, unspecified: Secondary | ICD-10-CM

## 2022-11-02 DIAGNOSIS — E669 Obesity, unspecified: Secondary | ICD-10-CM

## 2022-11-02 DIAGNOSIS — Z6829 Body mass index (BMI) 29.0-29.9, adult: Secondary | ICD-10-CM

## 2022-11-02 MED ORDER — WEGOVY 1.7 MG/0.75ML ~~LOC~~ SOAJ
1.7000 mg | SUBCUTANEOUS | 0 refills | Status: DC
Start: 2022-11-02 — End: 2022-11-30
  Filled 2022-11-02 – 2022-11-13 (×2): qty 3, 28d supply, fill #0

## 2022-11-02 NOTE — Assessment & Plan Note (Signed)
Last fasting insulin 12.3, improved from previous Working on a low sugar diet rich in lean protein and fiber Has ramped up exercise frequency including both cardio and resistance training Doing well on GLP-1 receptor agonist for obesity management Has associated PCOS.  Recheck fasting insulin next visit Continue current healthy lifestyle changes

## 2022-11-02 NOTE — Progress Notes (Signed)
Office: [phone]  /  Fax: [phone]  WEIGHT SUMMARY AND BIOMETRICS  Starting Date: 01/20/22  Starting Weight: 227lb   Weight Lost Since Last Visit: 13lb   Vitals Temp: 98.8 F (37.1 C) BP: 117/76 Pulse Rate: 81 SpO2: 96 %   Body Composition  Body Fat %: 38.2 % Fat Mass (lbs): 74.8 lbs Muscle Mass (lbs): 114.6 lbs Total Body Water (lbs): 81.4 lbs Visceral Fat Rating : 6    HPI  Chief Complaint: OBESITY  Annette Blevins is here to discuss her progress with her obesity treatment plan. She is on the the Category 4 Plan and states she is following her eating plan approximately 70 % of the time. She states she is exercising 60-120 minutes 3-4 times per week.   Interval History:  Since last office visit she is down 13 lb She has a net weight loss of 32 lb in the past 9 mos of medically supervised weight management This is a 14% TBW loss She is using a Pel[employer] ap for weight training 2 x a week and kayaking and walking 3-4 x a week She is no longer binge eating and is getting in all of her meals She is lacking vegetables and has tried to increase high protein snacks Her portion sizes at mealtime is smaller with Wegovy.  She has had some associated nausea and constipation but has Zofran for prn use and Miralax for prn use  Pharmacotherapy: Wegovy 1.7 mg weekly   PHYSICAL EXAM:  Blood pressure 117/76, pulse 81, temperature 98.8 F (37.1 C), height 5\' 8"  (1.727 m), weight 195 lb (88.5 kg), SpO2 96 %. Body mass index is 29.65 kg/m.  General: She is overweight, cooperative, alert, well developed, and in no acute distress. PSYCH: Has normal mood, affect and thought process.   Lungs: Normal breathing effort, no conversational dyspnea.   ASSESSMENT AND PLAN  TREATMENT PLAN FOR OBESITY:  Recommended Dietary Goals  Annette Blevins is currently in the action stage of change. As such, her goal is to continue weight management plan. She has agreed to the Category 4  Plan.  Behavioral Intervention  We discussed the following Behavioral Modification Strategies today: increasing lean protein intake, decreasing simple carbohydrates , increasing vegetables, increasing lower glycemic fruits, increasing water intake, work on meal planning and preparation, keeping healthy foods at home, work on managing stress, creating time for self-care and relaxation measures, continue to practice mindfulness when eating, and planning for success.  Additional resources provided today: NA  Recommended Physical Activity Goals  Annette Blevins has been advised to work up to 150 minutes of moderate intensity aerobic activity a week and strengthening exercises 2-3 times per week for cardiovascular health, weight loss maintenance and preservation of muscle mass.   She has agreed to Continue current level of physical activity   Pharmacotherapy changes for the treatment of obesity: none  ASSOCIATED CONDITIONS ADDRESSED TODAY  Insulin resistance Assessment & Plan: Last fasting insulin 12.3, improved from previous Working on a low sugar diet rich in lean protein and fiber Has ramped up exercise frequency including both cardio and resistance training Doing well on GLP-1 receptor agonist for obesity management Has associated PCOS.  Recheck fasting insulin next visit Continue current healthy lifestyle changes   Generalized obesity: Starting BMI 34.5 -     Wegovy; Inject 1.7 mg into the skin once a week.  Dispense: 3 mL; Refill: 0  BMI 29.0-29.9,adult  Vitamin D deficiency Assessment & Plan: Last vitamin D Lab Results  Component Value Date  VD25OH 26.9 (L) 06/25/2022   She reports poor adherence taking RX vitamin D weekly.  Plan: keep Vitamin D Rx near Annette Blevins as a reminder for weekly use of both Recheck vitamin D level next visit       She was informed of the importance of frequent follow up visits to maximize her success with intensive lifestyle modifications for her  multiple health conditions.   ATTESTASTION STATEMENTS:  Reviewed by clinician on day of visit: allergies, medications, problem list, medical history, surgical history, family history, social history, and previous encounter notes pertinent to obesity diagnosis.   I have personally spent 30 minutes total time today in preparation, patient care, nutritional counseling and documentation for this visit, including the following: review of clinical lab tests; review of medical tests/procedures/services.      Annette Brink, DO DABFM, DABOM [facility] [address] [place], [place] [address] [phone]

## 2022-11-02 NOTE — Assessment & Plan Note (Signed)
Last vitamin D Lab Results  Component Value Date   VD25OH 26.9 (L) 06/25/2022   She reports poor adherence taking RX vitamin D weekly.  Plan: keep Vitamin D Rx near [facility] as a reminder for weekly use of both Recheck vitamin D level next visit

## 2022-11-03 ENCOUNTER — Other Ambulatory Visit (HOSPITAL_COMMUNITY): Payer: Self-pay

## 2022-11-13 ENCOUNTER — Other Ambulatory Visit (HOSPITAL_COMMUNITY): Payer: Self-pay

## 2022-11-30 ENCOUNTER — Ambulatory Visit (INDEPENDENT_AMBULATORY_CARE_PROVIDER_SITE_OTHER): Payer: 59 | Admitting: Family Medicine

## 2022-11-30 ENCOUNTER — Other Ambulatory Visit (HOSPITAL_COMMUNITY): Payer: Self-pay

## 2022-11-30 ENCOUNTER — Encounter (INDEPENDENT_AMBULATORY_CARE_PROVIDER_SITE_OTHER): Payer: Self-pay | Admitting: Family Medicine

## 2022-11-30 VITALS — BP 110/75 | HR 102 | Temp 98.6°F | Ht 68.0 in | Wt 188.0 lb

## 2022-11-30 DIAGNOSIS — E559 Vitamin D deficiency, unspecified: Secondary | ICD-10-CM | POA: Diagnosis not present

## 2022-11-30 DIAGNOSIS — E785 Hyperlipidemia, unspecified: Secondary | ICD-10-CM | POA: Diagnosis not present

## 2022-11-30 DIAGNOSIS — R5383 Other fatigue: Secondary | ICD-10-CM

## 2022-11-30 DIAGNOSIS — E88819 Insulin resistance, unspecified: Secondary | ICD-10-CM | POA: Diagnosis not present

## 2022-11-30 DIAGNOSIS — E669 Obesity, unspecified: Secondary | ICD-10-CM

## 2022-11-30 DIAGNOSIS — Z6828 Body mass index (BMI) 28.0-28.9, adult: Secondary | ICD-10-CM

## 2022-11-30 DIAGNOSIS — R7303 Prediabetes: Secondary | ICD-10-CM

## 2022-11-30 MED ORDER — WEGOVY 1.7 MG/0.75ML ~~LOC~~ SOAJ
1.7000 mg | SUBCUTANEOUS | 1 refills | Status: DC
Start: 2022-11-30 — End: 2022-12-31
  Filled 2022-11-30 – 2022-12-10 (×2): qty 3, 28d supply, fill #0

## 2022-11-30 NOTE — Assessment & Plan Note (Signed)
No results found for: "CHOL", "HDL", "LDLCALC", "LDLDIRECT", "TRIG", "CHOLHDL" LDL 148 Aug 2023 + fam hx of HLD Has followed a low sat fat diet and lost 39 lb in 10 mos  Recheck lab today

## 2022-11-30 NOTE — Assessment & Plan Note (Signed)
Last vitamin D Lab Results  Component Value Date   VD25OH 26.9 (L) 06/25/2022   Has been taking Rx ergo weekly Energy level improving Recheck lab today

## 2022-11-30 NOTE — Assessment & Plan Note (Signed)
Lab Results  Component Value Date   HGBA1C 5.6 06/25/2022   Has normalized with dietary change, exercise and weight loss

## 2022-11-30 NOTE — Progress Notes (Signed)
Office: [phone]  /  Fax: [phone]  WEIGHT SUMMARY AND BIOMETRICS  Starting Date: 01/20/22  Starting Weight: 227lb   Weight Lost Since Last Visit: 7lb   Vitals Temp: 98.6 F (37 C) BP: 110/75 Pulse Rate: (!) 102 SpO2: 96 %   Body Composition  Body Fat %: 37.1 % Fat Mass (lbs): 69.8 lbs Muscle Mass (lbs): 112.4 lbs Total Body Water (lbs): 78.2 lbs Visceral Fat Rating : 5     HPI  Chief Complaint: OBESITY  Annette Blevins is here to discuss her progress with her obesity treatment plan. She is on the the Category 4 Plan and states she is following her eating plan approximately 85 % of the time. She states she is exercising 60 minutes 2-3 times per week.   Interval History:  Since last office visit she is down 7 lb This gives her a net weight loss of 39 lb This is a 17% TBW loss She does struggle to get all of her dinner due to [facility] 1.7 mg  Denies GI SE Drinking more water and eating more fruit Husband is supportive Going to [employer] 4 x a week  Pharmacotherapy: Wegovy 1.7 mg weekly  PHYSICAL EXAM:  Blood pressure 110/75, pulse (!) 102, temperature 98.6 F (37 C), height 5\' 8"  (1.727 m), weight 188 lb (85.3 kg), SpO2 96%. Body mass index is 28.59 kg/m.  General: She is overweight, cooperative, alert, well developed, and in no acute distress. PSYCH: Has normal mood, affect and thought process.   Lungs: Normal breathing effort, no conversational dyspnea.   ASSESSMENT AND PLAN  TREATMENT PLAN FOR OBESITY:  Recommended Dietary Goals  Annette Blevins is currently in the action stage of change. As such, her goal is to continue weight management plan. She has agreed to the Category 4 Plan.  Behavioral Intervention  We discussed the following Behavioral Modification Strategies today: increasing lean protein intake, decreasing simple carbohydrates , increasing vegetables, increasing lower glycemic fruits, avoiding skipping meals, increasing water intake, work  on meal planning and preparation, continue to practice mindfulness when eating, and planning for success.  Additional resources provided today: NA  Recommended Physical Activity Goals  Annette Blevins has been advised to work up to 150 minutes of moderate intensity aerobic activity a week and strengthening exercises 2-3 times per week for cardiovascular health, weight loss maintenance and preservation of muscle mass.   She has agreed to Work on scheduling and tracking physical activity.   Pharmacotherapy changes for the treatment of obesity: none  ASSOCIATED CONDITIONS ADDRESSED TODAY  Vitamin D deficiency Assessment & Plan: Last vitamin D Lab Results  Component Value Date   VD25OH 26.9 (L) 06/25/2022   Has been taking Rx ergo weekly Energy level improving Recheck lab today  Orders: -     VITAMIN D 25 Hydroxy (Vit-D Deficiency, Fractures)  Generalized obesity: Starting BMI 34.5 -     GNFAOZ; Inject 1.7 mg into the skin once a week.  Dispense: 3 mL; Refill: 1  Insulin resistance -     Insulin, random  Dyslipidemia Assessment & Plan: No results found for: "CHOL", "HDL", "LDLCALC", "LDLDIRECT", "TRIG", "CHOLHDL" LDL 148 Aug 2023 + fam hx of HLD Has followed a low sat fat diet and lost 39 lb in 10 mos  Recheck lab today  Orders: -     Lipid panel  Other fatigue -     Comprehensive metabolic panel  Prediabetes -     Hemoglobin A1c  BMI 28.0-28.9,adult  Pre-diabetes Assessment & Plan:  Lab Results  Component Value Date   HGBA1C 5.6 06/25/2022   Has normalized with dietary change, exercise and weight loss       She was informed of the importance of frequent follow up visits to maximize her success with intensive lifestyle modifications for her multiple health conditions.   ATTESTASTION STATEMENTS:  Reviewed by clinician on day of visit: allergies, medications, problem list, medical history, surgical history, family history, social history, and previous encounter  notes pertinent to obesity diagnosis.   I have personally spent 30 minutes total time today in preparation, patient care, nutritional counseling and documentation for this visit, including the following: review of clinical lab tests; review of medical tests/procedures/services.      [name], DO DABFM, DABOM [facility] [address] [place], [place] [address] [phone]

## 2022-12-01 LAB — COMPREHENSIVE METABOLIC PANEL
ALT: 13 IU/L (ref 0–32)
AST: 19 IU/L (ref 0–40)
Albumin: 4.4 g/dL (ref 4.0–5.0)
Alkaline Phosphatase: 87 IU/L (ref 44–121)
BUN/Creatinine Ratio: 13 (ref 9–23)
BUN: 10 mg/dL (ref 6–20)
Bilirubin Total: 0.2 mg/dL (ref 0.0–1.2)
CO2: 20 mmol/L (ref 20–29)
Calcium: 9.9 mg/dL (ref 8.7–10.2)
Chloride: 101 mmol/L (ref 96–106)
Creatinine, Ser: 0.8 mg/dL (ref 0.57–1.00)
Globulin, Total: 2.6 g/dL (ref 1.5–4.5)
Glucose: 91 mg/dL (ref 70–99)
Potassium: 4.2 mmol/L (ref 3.5–5.2)
Sodium: 140 mmol/L (ref 134–144)
Total Protein: 7 g/dL (ref 6.0–8.5)
eGFR: 105 mL/min/{1.73_m2} (ref >59)

## 2022-12-01 LAB — LIPID PANEL
Chol/HDL Ratio: 4.2 ratio (ref 0.0–4.4)
Cholesterol, Total: 261 mg/dL — ABNORMAL HIGH (ref 100–199)
HDL: 62 mg/dL (ref >39)
LDL Chol Calc (NIH): 175 mg/dL — ABNORMAL HIGH (ref 0–99)
Triglycerides: 134 mg/dL (ref 0–149)
VLDL Cholesterol Cal: 24 mg/dL (ref 5–40)

## 2022-12-01 LAB — INSULIN, RANDOM: INSULIN: 8.6 u[IU]/mL (ref 2.6–24.9)

## 2022-12-01 LAB — HEMOGLOBIN A1C
Est. average glucose Bld gHb Est-mCnc: 108 mg/dL
Hgb A1c MFr Bld: 5.4 % (ref 4.8–5.6)

## 2022-12-01 LAB — VITAMIN D 25 HYDROXY (VIT D DEFICIENCY, FRACTURES): Vit D, 25-Hydroxy: 47.9 ng/mL (ref 30.0–100.0)

## 2022-12-11 ENCOUNTER — Other Ambulatory Visit (HOSPITAL_COMMUNITY): Payer: Self-pay

## 2022-12-15 ENCOUNTER — Other Ambulatory Visit (HOSPITAL_COMMUNITY): Payer: Self-pay

## 2022-12-30 NOTE — Progress Notes (Unsigned)
TeleHealth Visit:  This visit was completed with telemedicine (audio/video) technology. Annette Blevins has verbally consented to this TeleHealth visit. The patient is located at home, the provider is located at home. The participants in this visit include the listed provider and patient. The visit was conducted today via MyChart video.  OBESITY Annette Blevins is here to discuss her progress with her obesity treatment plan along with follow-up of her obesity related diagnoses.   Today's visit was # 14 Starting weight: 227 lbs Starting date: 01/20/22 Weight at last in office visit: 188 lbs on 11/30/22 Total weight loss: 39 lbs at last in office visit on 11/30/22. Today's reported weight (12/31/22): none reported  Nutrition Plan: the Category 4 plan - 60% adherence.  Current exercise:  resistance/cardio 60 minutes 3-4 times per week at [employer]. Total of 3-4 hours per week exercise.  Interim History:  She has been struggling with the meal plan since her last visit.  She reports she is eating more unhealthy food.  This is worse during her period. She estimates she is sticking to plan about 60%. Has been struggling with food cravings and hunger.  She is currently on Wegovy 1.7 mg.  Has had nausea with the [facility] but has not had it recently.  Her goal weight is 150 (22 BMI).   Assessment/Plan:  We discussed recent lab results in depth.  1. Vitamin D Deficiency Vitamin D is nearly at goal of 50.  Most recent vitamin D level was 47.9.[name] She is on  prescription ergocalciferol 50,000 IU weekly.  Plan: Continue  prescription ergocalciferol 50,000 IU weekly  2. Hyperlipidemia LDL elevated at 175, HDL normal at 62, triglycerides normal at 134. She denies early ASCVD in her family but reports strong family history of high cholesterol. Medication(s): none  Lab Results  Component Value Date   CHOL 261 (H) 11/30/2022   HDL 62 11/30/2022   LDLCALC 175 (H) 11/30/2022   TRIG 134 11/30/2022    CHOLHDL 4.2 11/30/2022   Lab Results  Component Value Date   ALT 13 11/30/2022   AST 19 11/30/2022   ALKPHOS 87 11/30/2022   BILITOT 0.2 11/30/2022   The ASCVD Risk score (Arnett DK, et al., 2019) failed to calculate for the following reasons:   The 2019 ASCVD risk score is only valid for ages 58 to 73  Plan: We discussed possibility of using statin and decided that she will continue to work on lifestyle modification since she has not had family members with early ASCVD. Continue exercise. Continue weight loss. Increase fiber in diet. Avoid trans fats and limit saturated fats.  3. Insulin Resistance Last fasting insulin was 8.6, much improved from 21 when she first started our program on 01/20/2022.  A1c was 5.4, down from 5.6.. Medication(s): Wegovy 1.7 SQ weekly Lab Results  Component Value Date   HGBA1C 5.4 11/30/2022   HGBA1C 5.6 06/25/2022   HGBA1C 5.3 02/01/2020   Lab Results  Component Value Date   INSULIN 8.6 11/30/2022   INSULIN 12.3 06/25/2022   INSULIN 21.5 01/20/2022    Plan: Continue and decrease dose Wegovy 2.4 mg SQ weekly   4.  Tachycardia Has had elevated resting heart rate ever since she had COVID.  Wore a heart monitor when this first occurred.  Resting heart rate runs high 90s low 100s. Runs in 180s with vigorous exercise. Denies palpitations.  Plan: She will monitor her resting heart rate a few times per week, record, and bring to her next visit. Discussed  with Dr. Marquis Lunch at next visit.  5.  Nausea Tends to have nausea with increased dose of Wegovy.  Increasing dose to 2.4 mg today.  Plan: Refill ondansetron 8 mg disintegrating tablet 8 mg every 8 hours as needed for nausea  6. Generalized Obesity: Current BMI 28  Pharmacotherapy Plan Continue and decrease dose  Wegovy 2.4 mg SQ weekly  [name] is currently in the action stage of change. As such, her goal is to continue with weight loss efforts.  She has agreed to the Category 4  plan.  Increase compliance with plan to 85-90%.  Exercise goals:  as is  Behavioral modification strategies: increasing lean protein intake, decreasing simple carbohydrates , decrease eating out, and planning for success.  Wilda has agreed to follow-up with our clinic in 5 weeks.  No orders of the defined types were placed in this encounter.   Medications Discontinued During This Encounter  Medication Reason   Semaglutide-Weight Management (WEGOVY) 1.7 MG/0.75ML SOAJ    ondansetron (ZOFRAN-ODT) 8 MG disintegrating tablet Reorder     Meds ordered this encounter  Medications   Semaglutide-Weight Management (WEGOVY) 2.4 MG/0.75ML SOAJ    Sig: Inject 2.4 mg into the skin once a week.    Dispense:  3 mL    Refill:  0    Order Specific Question:   Supervising Provider    Answer:   Seymour Bars E [[id number]]   ondansetron (ZOFRAN-ODT) 8 MG disintegrating tablet    Sig: Take 1 tablet (8 mg total) by mouth every 8 (eight) hours as needed for nausea or vomiting.    Dispense:  20 tablet    Refill:  0    Order Specific Question:   Supervising Provider    Answer:   [name] [[id number]]      Objective:   VITALS: Per patient if applicable, see vitals. GENERAL: Alert and in no acute distress. CARDIOPULMONARY: No increased WOB. Speaking in clear sentences.  PSYCH: Pleasant and cooperative. Speech normal rate and rhythm. Affect is appropriate. Insight and judgement are appropriate. Attention is focused, linear, and appropriate.  NEURO: Oriented as arrived to appointment on time with no prompting.   Attestation Statements:   Reviewed by clinician on day of visit: allergies, medications, problem list, medical history, surgical history, family history, social history, and previous encounter notes.   This was prepared with the assistance of Engineer, civil (consulting).  Occasional wrong-word or sound-a-like substitutions may have occurred due to the inherent limitations of voice recognition  software.

## 2022-12-31 ENCOUNTER — Other Ambulatory Visit (HOSPITAL_COMMUNITY): Payer: Self-pay

## 2022-12-31 ENCOUNTER — Telehealth (INDEPENDENT_AMBULATORY_CARE_PROVIDER_SITE_OTHER): Payer: 59 | Admitting: Family Medicine

## 2022-12-31 ENCOUNTER — Encounter (INDEPENDENT_AMBULATORY_CARE_PROVIDER_SITE_OTHER): Payer: Self-pay | Admitting: Family Medicine

## 2022-12-31 DIAGNOSIS — E88819 Insulin resistance, unspecified: Secondary | ICD-10-CM | POA: Diagnosis not present

## 2022-12-31 DIAGNOSIS — R110 Nausea: Secondary | ICD-10-CM | POA: Diagnosis not present

## 2022-12-31 DIAGNOSIS — E559 Vitamin D deficiency, unspecified: Secondary | ICD-10-CM

## 2022-12-31 DIAGNOSIS — R000 Tachycardia, unspecified: Secondary | ICD-10-CM

## 2022-12-31 DIAGNOSIS — E785 Hyperlipidemia, unspecified: Secondary | ICD-10-CM | POA: Diagnosis not present

## 2022-12-31 DIAGNOSIS — E669 Obesity, unspecified: Secondary | ICD-10-CM

## 2022-12-31 DIAGNOSIS — E7849 Other hyperlipidemia: Secondary | ICD-10-CM

## 2022-12-31 DIAGNOSIS — Z6828 Body mass index (BMI) 28.0-28.9, adult: Secondary | ICD-10-CM

## 2022-12-31 MED ORDER — WEGOVY 2.4 MG/0.75ML ~~LOC~~ SOAJ
2.4000 mg | SUBCUTANEOUS | 0 refills | Status: DC
Start: 2022-12-31 — End: 2023-02-01
  Filled 2022-12-31: qty 3, 28d supply, fill #0

## 2022-12-31 MED ORDER — ONDANSETRON 8 MG PO TBDP
8.0000 mg | ORAL_TABLET | Freq: Three times a day (TID) | ORAL | 0 refills | Status: DC | PRN
Start: 2022-12-31 — End: 2023-05-04
  Filled 2022-12-31: qty 20, 7d supply, fill #0

## 2023-01-05 ENCOUNTER — Other Ambulatory Visit (HOSPITAL_COMMUNITY): Payer: Self-pay

## 2023-01-05 MED ORDER — DROSPIRENONE-ETHINYL ESTRADIOL 3-0.02 MG PO TABS
1.0000 | ORAL_TABLET | Freq: Every day | ORAL | 3 refills | Status: DC
Start: 2023-01-05 — End: 2024-01-25
  Filled 2023-01-05 – 2023-01-29 (×2): qty 28, 28d supply, fill #0
  Filled 2023-03-20: qty 28, 28d supply, fill #1
  Filled 2023-04-14: qty 28, 28d supply, fill #2
  Filled 2023-05-17: qty 28, 28d supply, fill #3
  Filled 2023-06-10: qty 28, 28d supply, fill #4
  Filled 2023-07-12: qty 28, 28d supply, fill #5
  Filled 2023-08-05: qty 28, 28d supply, fill #6
  Filled 2023-09-03: qty 28, 28d supply, fill #7
  Filled 2023-10-01: qty 28, 28d supply, fill #8
  Filled 2023-11-02: qty 28, 28d supply, fill #9
  Filled 2023-11-30: qty 28, 28d supply, fill #10
  Filled 2023-12-31: qty 28, 28d supply, fill #11

## 2023-01-06 ENCOUNTER — Other Ambulatory Visit (HOSPITAL_COMMUNITY): Payer: Self-pay

## 2023-01-14 ENCOUNTER — Other Ambulatory Visit (HOSPITAL_COMMUNITY): Payer: Self-pay

## 2023-01-29 ENCOUNTER — Other Ambulatory Visit: Payer: Self-pay

## 2023-02-01 ENCOUNTER — Telehealth (INDEPENDENT_AMBULATORY_CARE_PROVIDER_SITE_OTHER): Payer: Self-pay

## 2023-02-01 ENCOUNTER — Ambulatory Visit (INDEPENDENT_AMBULATORY_CARE_PROVIDER_SITE_OTHER): Payer: 59 | Admitting: Family Medicine

## 2023-02-01 ENCOUNTER — Other Ambulatory Visit (HOSPITAL_COMMUNITY): Payer: Self-pay

## 2023-02-01 ENCOUNTER — Other Ambulatory Visit: Payer: Self-pay | Admitting: Bariatrics

## 2023-02-01 DIAGNOSIS — E669 Obesity, unspecified: Secondary | ICD-10-CM

## 2023-02-01 MED ORDER — WEGOVY 2.4 MG/0.75ML ~~LOC~~ SOAJ
2.4000 mg | SUBCUTANEOUS | 0 refills | Status: DC
Start: 2023-02-01 — End: 2023-03-03
  Filled 2023-02-01: qty 3, 28d supply, fill #0

## 2023-02-01 NOTE — Telephone Encounter (Signed)
Pt needs of refill of Wegovy, last appt was moved due to provider out of the office, please send to [facility]

## 2023-02-01 NOTE — Telephone Encounter (Signed)
LAST APPOINTMENT DATE: 12/31/22 NEXT APPOINTMENT DATE: 02/10/23   [id number] [place], [place] - [address] AT [facility] [address] [place] [address] Phone: [phone] Fax: [phone]  [place] [facility] [address] [place] [address] Phone: [phone] Fax: [phone]  [facility] [address] [place] [address] Phone: [phone] Fax: [phone]  [facility] - [place], [place] - [address] [facility] [address] [place] [address] [place] [address] Phone: [phone] Fax: [phone]  [facility] - [place], [place] - [address] [place] [address] Phone: [phone] Fax: [phone]  Patient is requesting a refill of the following medications: No prescriptions requested or ordered in this encounter   Date last filled: 12/31/22 Previously prescribed by [facility]  Lab Results      Component                Value               Date                      HGBA1C                   5.4                 11/30/2022                HGBA1C                   5.6                 06/25/2022                HGBA1C                   5.3                 02/01/2020           Lab Results      Component                Value               Date                      LDLCALC                  175 (H)             11/30/2022                CREATININE               0.80                11/30/2022           Lab Results      Component                Value               Date                      VD25OH  47.9                11/30/2022                VD25OH                   26.9 (L)            06/25/2022                VD25OH                   19.0 (L)            01/20/2022            BP Readings from Last 3 Encounters: 11/30/22 :  110/75 11/02/22 : 117/76 09/15/22 : 120/78

## 2023-02-02 ENCOUNTER — Telehealth (INDEPENDENT_AMBULATORY_CARE_PROVIDER_SITE_OTHER): Payer: Self-pay

## 2023-02-02 ENCOUNTER — Other Ambulatory Visit: Payer: Self-pay

## 2023-02-02 ENCOUNTER — Other Ambulatory Visit (HOSPITAL_COMMUNITY): Payer: Self-pay

## 2023-02-02 NOTE — Telephone Encounter (Signed)
Needs PA for [facility], she is supposed to take injection tomorrow

## 2023-02-03 ENCOUNTER — Other Ambulatory Visit (HOSPITAL_COMMUNITY): Payer: Self-pay

## 2023-02-03 ENCOUNTER — Telehealth: Payer: Self-pay | Admitting: *Deleted

## 2023-02-03 NOTE — Telephone Encounter (Signed)
Phone call to patient to let her know her Reginal Lutes was approved. She will call the office if she has any problems or concerns.

## 2023-02-03 NOTE — Telephone Encounter (Signed)
Prior authorization done via cover my meds for patients. Wegovy. Waiting on determination.

## 2023-02-04 NOTE — Telephone Encounter (Signed)
Prior authorization approved. Patient notified.

## 2023-02-10 ENCOUNTER — Encounter (INDEPENDENT_AMBULATORY_CARE_PROVIDER_SITE_OTHER): Payer: Self-pay | Admitting: Family Medicine

## 2023-02-10 ENCOUNTER — Ambulatory Visit (INDEPENDENT_AMBULATORY_CARE_PROVIDER_SITE_OTHER): Payer: 59 | Admitting: Family Medicine

## 2023-02-10 VITALS — BP 134/80 | HR 122 | Temp 98.2°F | Ht 68.0 in | Wt 173.0 lb

## 2023-02-10 DIAGNOSIS — E669 Obesity, unspecified: Secondary | ICD-10-CM

## 2023-02-10 DIAGNOSIS — Z6826 Body mass index (BMI) 26.0-26.9, adult: Secondary | ICD-10-CM | POA: Diagnosis not present

## 2023-02-10 DIAGNOSIS — E7849 Other hyperlipidemia: Secondary | ICD-10-CM

## 2023-02-10 DIAGNOSIS — R7303 Prediabetes: Secondary | ICD-10-CM | POA: Diagnosis not present

## 2023-02-10 NOTE — Progress Notes (Signed)
Chief Complaint:   OBESITY Annette Blevins is here to discuss her progress with her obesity treatment plan along with follow-up of her obesity related diagnoses. Annette Blevins is on the Category 4 Plan and states she is following her eating plan approximately 60% of the time. Annette Blevins states she is doing 0 minutes 0 times per week.  Today's visit was #: 17 Starting weight: 227 lbs Starting date: 01/20/2022 Today's weight: 173 lbs Today's date: 02/10/2023 Total lbs lost to date: 54 Total lbs lost since last in-office visit: 15  Interim History: Patient is trying to get back on her routine.  She went back to school a month and a half ago and that plus work has led to less meal prep. Her biggest issue is dinner and she thinks making it simple will be the way to stay the most successful. Patient is thinking perhaps incorporating readily made meals or protein sources would make dinner easier to stay compliant with.   Subjective:   1. Other hyperlipidemia Patient is not on medications currently.  Her last LDL was 175, HDL 62, and triglycerides 409.  She has a strong family history of hyperlipidemia and CAD.  2. Prediabetes Patient's last A1c was 5.4 (previously up to 5.7).  No GI side effects of Wegovy.  Assessment/Plan:   1. Other hyperlipidemia We will follow-up on patient's labs in November.  2. Prediabetes We will repeat labs in November/December.  3. BMI 26.0-26.9,adult  4. Obesity with starting BMI of 34.5 Annette Blevins is currently in the action stage of change. As such, her goal is to continue with weight loss efforts. She has agreed to the Category 4 Plan and keeping a food journal and adhering to recommended goals of 550-650 calories and 50+ grams of protein at supper daily.   Exercise goals: No exercise has been prescribed at this time.  Behavioral modification strategies: increasing lean protein intake, meal planning and cooking strategies, keeping healthy foods in the home, and planning for  success.  Annette Blevins has agreed to follow-up with our clinic in 4 weeks. She was informed of the importance of frequent follow-up visits to maximize her success with intensive lifestyle modifications for her multiple health conditions.   Objective:   Blood pressure 134/80, pulse (!) 122, temperature 98.2 F (36.8 C), height 5\' 8"  (1.727 m), weight 173 lb (78.5 kg), SpO2 97%. Body mass index is 26.3 kg/m.  General: Cooperative, alert, well developed, in no acute distress. HEENT: Conjunctivae and lids unremarkable. Cardiovascular: Regular rhythm.  Lungs: Normal work of breathing. Neurologic: No focal deficits.   Lab Results  Component Value Date   CREATININE 0.80 11/30/2022   BUN 10 11/30/2022   NA 140 11/30/2022   K 4.2 11/30/2022   CL 101 11/30/2022   CO2 20 11/30/2022   Lab Results  Component Value Date   ALT 13 11/30/2022   AST 19 11/30/2022   ALKPHOS 87 11/30/2022   BILITOT 0.2 11/30/2022   Lab Results  Component Value Date   HGBA1C 5.4 11/30/2022   HGBA1C 5.6 06/25/2022   HGBA1C 5.3 02/01/2020   Lab Results  Component Value Date   INSULIN 8.6 11/30/2022   INSULIN 12.3 06/25/2022   INSULIN 21.5 01/20/2022   Lab Results  Component Value Date   TSH 2.200 01/20/2022   Lab Results  Component Value Date   CHOL 261 (H) 11/30/2022   HDL 62 11/30/2022   LDLCALC 175 (H) 11/30/2022   TRIG 134 11/30/2022   CHOLHDL 4.2 11/30/2022  Lab Results  Component Value Date   VD25OH 47.9 11/30/2022   VD25OH 26.9 (L) 06/25/2022   VD25OH 19.0 (L) 01/20/2022   Lab Results  Component Value Date   WBC 9.0 05/15/2020   HGB 14.2 05/15/2020   HCT 44.3 05/15/2020   MCV 87.4 05/15/2020   PLT 382 05/15/2020   No results found for: "IRON", "TIBC", "FERRITIN"  Attestation Statements:   Reviewed by clinician on day of visit: allergies, medications, problem list, medical history, surgical history, family history, social history, and previous encounter notes.   I, Burt Knack, am acting as transcriptionist for Reuben Likes, MD.  I have reviewed the above documentation for accuracy and completeness, and I agree with the above. - Reuben Likes, MD

## 2023-02-17 ENCOUNTER — Other Ambulatory Visit (HOSPITAL_COMMUNITY): Payer: Self-pay

## 2023-02-27 ENCOUNTER — Other Ambulatory Visit (HOSPITAL_COMMUNITY): Payer: Self-pay

## 2023-03-03 ENCOUNTER — Telehealth (INDEPENDENT_AMBULATORY_CARE_PROVIDER_SITE_OTHER): Payer: 59 | Admitting: Family Medicine

## 2023-03-03 ENCOUNTER — Encounter (INDEPENDENT_AMBULATORY_CARE_PROVIDER_SITE_OTHER): Payer: Self-pay | Admitting: Family Medicine

## 2023-03-03 ENCOUNTER — Other Ambulatory Visit (HOSPITAL_COMMUNITY): Payer: Self-pay

## 2023-03-03 DIAGNOSIS — Z6834 Body mass index (BMI) 34.0-34.9, adult: Secondary | ICD-10-CM

## 2023-03-03 DIAGNOSIS — E669 Obesity, unspecified: Secondary | ICD-10-CM | POA: Diagnosis not present

## 2023-03-03 DIAGNOSIS — E7849 Other hyperlipidemia: Secondary | ICD-10-CM

## 2023-03-03 DIAGNOSIS — Z6826 Body mass index (BMI) 26.0-26.9, adult: Secondary | ICD-10-CM | POA: Diagnosis not present

## 2023-03-03 MED ORDER — WEGOVY 2.4 MG/0.75ML ~~LOC~~ SOAJ
2.4000 mg | SUBCUTANEOUS | 0 refills | Status: DC
Start: 2023-03-03 — End: 2023-03-30
  Filled 2023-03-03: qty 3, 28d supply, fill #0

## 2023-03-03 NOTE — Progress Notes (Signed)
TeleHealth Visit:  Due to the COVID-19 pandemic, this visit was completed with telemedicine (audio/video) technology to reduce patient and provider exposure as well as to preserve personal protective equipment.   [name] has verbally consented to this TeleHealth visit. The patient is located at home, the provider is located at the [facility] [facility] office. The participants in this visit include the listed provider and patient. The visit was conducted today via MyChart video.   Chief Complaint: OBESITY Annette Blevins is here to discuss her progress with her obesity treatment plan along with follow-up of her obesity related diagnoses. Annette Blevins is on the Category 4 Plan and keeping a food journal and adhering to recommended goals of 550-650 calories and 50+ grams of protein at supper and states she is following her eating plan approximately 85-90% of the time. Annette Blevins states she is walking for 60 minutes time per week.  Today's visit was #: 18 Starting weight: 227 lbs Starting date: 01/20/2022  Interim History: Patient is working on getting some activity consistently in her routine. She is going to [place] this weekend. She has struggled with breakfast and she has opted for [employer] protein shake. She has not always gotten all snack calories in. Recent increase in Wegovy up to 2.4 mg.   Subjective:   1. Other hyperlipidemia Patient's last LDL was elevated at 175. She is not on medications.   Assessment/Plan:   1. Other hyperlipidemia We will repeat labs in January, if LDL is consistently elevated we will consider statin.   2. BMI 26.0-26.9,adult  3. Obesity with starting BMI of 34.5 We will refill Wegovy 2.4 mg once weekly for 1 month.   - Semaglutide-Weight Management (WEGOVY) 2.4 MG/0.75ML SOAJ; Inject 2.4 mg into the skin once a week.  Dispense: 3 mL; Refill: 0  Annette Blevins is currently in the action stage of change. As such, her goal is to continue with weight loss efforts. She has agreed  to the Category 4 Plan.   Exercise goals: All adults should avoid inactivity. Some physical activity is better than none, and adults who participate in any amount of physical activity gain some health benefits.  Behavioral modification strategies: increasing lean protein intake, meal planning and cooking strategies, keeping healthy foods in the home, and planning for success.  Annette Blevins has agreed to follow-up with our clinic in 4 weeks. She was informed of the importance of frequent follow-up visits to maximize her success with intensive lifestyle modifications for her multiple health conditions.  Objective:   VITALS: Per patient if applicable, see vitals. GENERAL: Alert and in no acute distress. CARDIOPULMONARY: No increased WOB. Speaking in clear sentences.  PSYCH: Pleasant and cooperative. Speech normal rate and rhythm. Affect is appropriate. Insight and judgement are appropriate. Attention is focused, linear, and appropriate.  NEURO: Oriented as arrived to appointment on time with no prompting.   Lab Results  Component Value Date   CREATININE 0.80 11/30/2022   BUN 10 11/30/2022   NA 140 11/30/2022   K 4.2 11/30/2022   CL 101 11/30/2022   CO2 20 11/30/2022   Lab Results  Component Value Date   ALT 13 11/30/2022   AST 19 11/30/2022   ALKPHOS 87 11/30/2022   BILITOT 0.2 11/30/2022   Lab Results  Component Value Date   HGBA1C 5.4 11/30/2022   HGBA1C 5.6 06/25/2022   HGBA1C 5.3 02/01/2020   Lab Results  Component Value Date   INSULIN 8.6 11/30/2022   INSULIN 12.3 06/25/2022   INSULIN 21.5  01/20/2022   Lab Results  Component Value Date   TSH 2.200 01/20/2022   Lab Results  Component Value Date   CHOL 261 (H) 11/30/2022   HDL 62 11/30/2022   LDLCALC 175 (H) 11/30/2022   TRIG 134 11/30/2022   CHOLHDL 4.2 11/30/2022   Lab Results  Component Value Date   VD25OH 47.9 11/30/2022   VD25OH 26.9 (L) 06/25/2022   VD25OH 19.0 (L) 01/20/2022   Lab Results  Component  Value Date   WBC 9.0 05/15/2020   HGB 14.2 05/15/2020   HCT 44.3 05/15/2020   MCV 87.4 05/15/2020   PLT 382 05/15/2020   No results found for: "IRON", "TIBC", "FERRITIN"  Attestation Statements:   Reviewed by clinician on day of visit: allergies, medications, problem list, medical history, surgical history, family history, social history, and previous encounter notes.   I, Burt Knack, am acting as transcriptionist for Reuben Likes, MD. I have reviewed the above documentation for accuracy and completeness, and I agree with the above. - Reuben Likes, MD

## 2023-03-04 ENCOUNTER — Other Ambulatory Visit (HOSPITAL_COMMUNITY): Payer: Self-pay

## 2023-03-22 ENCOUNTER — Other Ambulatory Visit (HOSPITAL_COMMUNITY): Payer: Self-pay

## 2023-03-30 ENCOUNTER — Encounter (INDEPENDENT_AMBULATORY_CARE_PROVIDER_SITE_OTHER): Payer: Self-pay | Admitting: Family Medicine

## 2023-03-30 ENCOUNTER — Other Ambulatory Visit (HOSPITAL_COMMUNITY): Payer: Self-pay

## 2023-03-30 ENCOUNTER — Ambulatory Visit (INDEPENDENT_AMBULATORY_CARE_PROVIDER_SITE_OTHER): Payer: 59 | Admitting: Family Medicine

## 2023-03-30 VITALS — BP 104/69 | HR 69 | Temp 98.3°F | Ht 68.0 in | Wt 167.0 lb

## 2023-03-30 DIAGNOSIS — E669 Obesity, unspecified: Secondary | ICD-10-CM

## 2023-03-30 DIAGNOSIS — R7303 Prediabetes: Secondary | ICD-10-CM | POA: Diagnosis not present

## 2023-03-30 DIAGNOSIS — E7849 Other hyperlipidemia: Secondary | ICD-10-CM

## 2023-03-30 DIAGNOSIS — E559 Vitamin D deficiency, unspecified: Secondary | ICD-10-CM

## 2023-03-30 DIAGNOSIS — Z6834 Body mass index (BMI) 34.0-34.9, adult: Secondary | ICD-10-CM

## 2023-03-30 MED ORDER — VITAMIN D (ERGOCALCIFEROL) 1.25 MG (50000 UNIT) PO CAPS
50000.0000 [IU] | ORAL_CAPSULE | ORAL | 0 refills | Status: DC
Start: 2023-03-30 — End: 2023-05-04
  Filled 2023-03-30: qty 4, 28d supply, fill #0

## 2023-03-30 MED ORDER — WEGOVY 2.4 MG/0.75ML ~~LOC~~ SOAJ
2.4000 mg | SUBCUTANEOUS | 0 refills | Status: DC
Start: 2023-03-30 — End: 2023-05-04
  Filled 2023-03-30: qty 3, 28d supply, fill #0

## 2023-03-30 NOTE — Assessment & Plan Note (Signed)
 Discussed importance of vitamin d supplementation.  Vitamin d supplementation has been shown to decrease fatigue, decrease risk of progression to insulin resistance and then prediabetes, decreases risk of falling in older age and can even assist in decreasing depressive symptoms in PTSD.   Prescription for Vitamin D sent in.

## 2023-03-30 NOTE — Progress Notes (Signed)
SUBJECTIVE:  Chief Complaint: Obesity  Interim History: Patient went to [place] since last appointment and did some hiking but really felt the altitude. She is struggling with breakfast because she doesn't like eggs and is sick of yogurt.  She wants to know if she can eat lunch food for breakfast.  Currently doing more just bare nuggets and canned vegetables.  She is going to her in laws for Thanksgiving and then driving to and from [place] the next day to see her family. For the month of December she isn't sure what she is planning.  Anticipating work and social life nightmare.   Annette Blevins is here to discuss her progress with her obesity treatment plan. She is on the Category 3 Plan and keeping a food journal and adhering to recommended goals of 550-650 calories and 50+ grams of protein and states she is following her eating plan approximately 75 % of the time. She states she is exercising 30 minutes 3 times per week.   OBJECTIVE: Visit Diagnoses: Problem List Items Addressed This Visit       Other   Vitamin D deficiency    Discussed importance of vitamin d supplementation.  Vitamin d supplementation has been shown to decrease fatigue, decrease risk of progression to insulin resistance and then prediabetes, decreases risk of falling in older age and can even assist in decreasing depressive symptoms in PTSD.   Prescription for Vitamin D sent in.        Relevant Medications   Vitamin D, Ergocalciferol, (DRISDOL) 1.25 MG (50000 UNIT) CAPS capsule   Other Relevant Orders   VITAMIN D 25 Hydroxy (Vit-D Deficiency, Fractures) (Completed)   Pre-diabetes    Well controlled currently.  She is on GLP1 medication.  She is able to get all food in on plan.  Will get repeat labs today.      Other hyperlipidemia    Significantly elevated LDL on last FLP.  Not on medication.  Will repeat fasting lipids today.  Discussed with patient that given the elevation of her LDL in the past it is likely she  has a genetic component of her cholesterol.      Relevant Orders   Lipid Panel With LDL/HDL Ratio (Completed)   Other Visit Diagnoses     Obesity with starting BMI of 34.5    -  Primary   Relevant Medications   Semaglutide-Weight Management (WEGOVY) 2.4 MG/0.75ML SOAJ   Prediabetes       Relevant Orders   Hemoglobin A1c (Completed)   Insulin, random (Completed)   Comprehensive metabolic panel (Completed)       No data recorded  No data recorded  No data recorded  No data recorded    ASSESSMENT AND PLAN:  Diet: Karron is currently in the action stage of change. As such, her goal is to continue with weight loss efforts. She has agreed to Category 3 Plan.  Exercise: Marianah has been instructed that some exercise is better than none for weight loss and overall health benefits.  She wants to start doing more resistance training and is going to try to figure out how to implement that in a more consistent way.    Behavior Modification:  We discussed the following Behavioral Modification Strategies today: increasing lean protein intake, increasing vegetables, and meal planning and cooking strategies. We discussed various medication options to help Perlean with her weight loss efforts and we both agreed to continue Wegovy at same dose.  No follow-ups on file.[name] She was  informed of the importance of frequent follow up visits to maximize her success with intensive lifestyle modifications for her multiple health conditions.  Attestation Statements:   Reviewed by clinician on day of visit: allergies, medications, problem list, medical history, surgical history, family history, social history, and previous encounter notes.   Reuben Likes, MD

## 2023-03-31 ENCOUNTER — Ambulatory Visit (INDEPENDENT_AMBULATORY_CARE_PROVIDER_SITE_OTHER): Payer: 59 | Admitting: Family Medicine

## 2023-03-31 LAB — LIPID PANEL WITH LDL/HDL RATIO
Cholesterol, Total: 241 mg/dL — ABNORMAL HIGH (ref 100–199)
HDL: 65 mg/dL (ref >39)
LDL Chol Calc (NIH): 154 mg/dL — ABNORMAL HIGH (ref 0–99)
LDL/HDL Ratio: 2.4 ratio (ref 0.0–3.2)
Triglycerides: 124 mg/dL (ref 0–149)
VLDL Cholesterol Cal: 22 mg/dL (ref 5–40)

## 2023-03-31 LAB — INSULIN, RANDOM: INSULIN: 16.4 u[IU]/mL (ref 2.6–24.9)

## 2023-03-31 LAB — COMPREHENSIVE METABOLIC PANEL
ALT: 14 [IU]/L (ref 0–32)
AST: 16 [IU]/L (ref 0–40)
Albumin: 3.9 g/dL — ABNORMAL LOW (ref 4.0–5.0)
Alkaline Phosphatase: 85 [IU]/L (ref 44–121)
BUN/Creatinine Ratio: 11 (ref 9–23)
BUN: 9 mg/dL (ref 6–20)
Bilirubin Total: 0.3 mg/dL (ref 0.0–1.2)
CO2: 22 mmol/L (ref 20–29)
Calcium: 9.3 mg/dL (ref 8.7–10.2)
Chloride: 102 mmol/L (ref 96–106)
Creatinine, Ser: 0.85 mg/dL (ref 0.57–1.00)
Globulin, Total: 2.5 g/dL (ref 1.5–4.5)
Glucose: 82 mg/dL (ref 70–99)
Potassium: 4.2 mmol/L (ref 3.5–5.2)
Sodium: 138 mmol/L (ref 134–144)
Total Protein: 6.4 g/dL (ref 6.0–8.5)
eGFR: 97 mL/min/{1.73_m2} (ref >59)

## 2023-03-31 LAB — VITAMIN D 25 HYDROXY (VIT D DEFICIENCY, FRACTURES): Vit D, 25-Hydroxy: 28.1 ng/mL — ABNORMAL LOW (ref 30.0–100.0)

## 2023-03-31 LAB — HEMOGLOBIN A1C
Est. average glucose Bld gHb Est-mCnc: 103 mg/dL
Hgb A1c MFr Bld: 5.2 % (ref 4.8–5.6)

## 2023-04-01 DIAGNOSIS — E7849 Other hyperlipidemia: Secondary | ICD-10-CM | POA: Insufficient documentation

## 2023-04-01 NOTE — Assessment & Plan Note (Signed)
Significantly elevated LDL on last FLP.  Not on medication.  Will repeat fasting lipids today.  Discussed with patient that given the elevation of her LDL in the past it is likely she has a genetic component of her cholesterol.

## 2023-04-01 NOTE — Assessment & Plan Note (Signed)
Well controlled currently.  She is on GLP1 medication.  She is able to get all food in on plan.  Will get repeat labs today.

## 2023-05-04 ENCOUNTER — Telehealth (INDEPENDENT_AMBULATORY_CARE_PROVIDER_SITE_OTHER): Payer: 59 | Admitting: Family Medicine

## 2023-05-04 ENCOUNTER — Other Ambulatory Visit (HOSPITAL_COMMUNITY): Payer: Self-pay

## 2023-05-04 DIAGNOSIS — R110 Nausea: Secondary | ICD-10-CM

## 2023-05-04 DIAGNOSIS — Z6834 Body mass index (BMI) 34.0-34.9, adult: Secondary | ICD-10-CM

## 2023-05-04 DIAGNOSIS — E669 Obesity, unspecified: Secondary | ICD-10-CM

## 2023-05-04 DIAGNOSIS — E559 Vitamin D deficiency, unspecified: Secondary | ICD-10-CM

## 2023-05-04 DIAGNOSIS — E7849 Other hyperlipidemia: Secondary | ICD-10-CM | POA: Diagnosis not present

## 2023-05-04 MED ORDER — VITAMIN D (ERGOCALCIFEROL) 1.25 MG (50000 UNIT) PO CAPS
50000.0000 [IU] | ORAL_CAPSULE | ORAL | 0 refills | Status: DC
  Filled 2023-05-04: qty 5, 30d supply, fill #0

## 2023-05-04 MED ORDER — ATORVASTATIN CALCIUM 10 MG PO TABS
10.0000 mg | ORAL_TABLET | Freq: Every day | ORAL | 0 refills | Status: DC
  Filled 2023-05-04: qty 30, 30d supply, fill #0
  Filled 2023-06-01: qty 30, 30d supply, fill #1
  Filled 2023-07-07 (×2): qty 30, 30d supply, fill #2

## 2023-05-04 MED ORDER — WEGOVY 2.4 MG/0.75ML ~~LOC~~ SOAJ
2.4000 mg | SUBCUTANEOUS | 1 refills | Status: DC
Start: 2023-05-04 — End: 2023-06-14
  Filled 2023-05-04: qty 3, 28d supply, fill #0
  Filled 2023-06-01: qty 3, 28d supply, fill #1

## 2023-05-04 MED ORDER — ONDANSETRON 8 MG PO TBDP
8.0000 mg | ORAL_TABLET | Freq: Three times a day (TID) | ORAL | 0 refills | Status: DC | PRN
  Filled 2023-05-04: qty 20, 7d supply, fill #0

## 2023-05-04 NOTE — Progress Notes (Signed)
TeleHealth Visit:  Due to the COVID-19 pandemic, this visit was completed with telemedicine (audio/video) technology to reduce patient and provider exposure as well as to preserve personal protective equipment.   Annette Blevins has verbally consented to this TeleHealth visit. The patient is located at home, the provider is located at the [facility] office. The participants in this visit include the listed provider and patient.    Chief Complaint: OBESITY Annette Blevins is here to discuss her progress with her obesity treatment plan along with follow-up of her obesity related diagnoses. Annette Blevins is on the Category 3 Plan and states she is following her eating plan approximately 75-80% of the time. Annette Blevins states she is walking 90 minutes per week.  Today's visit was #: 20 Starting weight: 227 lbs Starting date: 01/20/22  Interim History: Since last appointment she has done well on keeping control of food choices and quantity over Thanksgiving.  She has also been very mindful over finals and recently had Covid.  She is feeling much better now. Over the next 3-4 weeks she is out of school until the next semester.  She is trying to incorporate more exercise.  Subjective:   There are no diagnoses linked to this encounter. Assessment/Plan:   Problem List Items Addressed This Visit       Other   Vitamin D deficiency   Most recent VIt D from last office visit still significantly below goal. Will refill Vit D.  Repeat lab in 3-4 months.  If still low will transition Vit D to D2.      Relevant Medications   Vitamin D, Ergocalciferol, (DRISDOL) 1.25 MG (50000 UNIT) CAPS capsule   Generalized obesity   Doing well on [facility] 2.4mg .  Needs refill today.  Will send in 2 months as patient is scheduled 5 weeks out due to provider availability.      Relevant Medications   Semaglutide-Weight Management (WEGOVY) 2.4 MG/0.75ML SOAJ   Nausea   Symptom well managed with PRN zofran.  Patient needs refill  today.  Discussed possible side effect of constipation to watch out  for.      Relevant Medications   ondansetron (ZOFRAN-ODT) 8 MG disintegrating tablet   Other hyperlipidemia - Primary   LDL still significantly elevated even with weight loss, dietary changes and activity increase.  Patient reports a significant family history of HLD and CAD.  Will start on statin- may want to refer to lipid clinic pending cholesterols response to statin.      Relevant Medications   atorvastatin (LIPITOR) 10 MG tablet   Other Visit Diagnoses       Obesity with starting BMI of 34.5       Relevant Medications   Semaglutide-Weight Management (WEGOVY) 2.4 MG/0.75ML SOAJ       There are no diagnoses linked to this encounter. Annette Blevins is currently in the action stage of change. As such, her goal is to continue with weight loss efforts. She has agreed to the Category 3 Plan.   Exercise goals: For substantial health benefits, adults should do at least 150 minutes (2 hours and 30 minutes) a week of moderate-intensity, or 75 minutes (1 hour and 15 minutes) a week of vigorous-intensity aerobic physical activity, or an equivalent combination of moderate- and vigorous-intensity aerobic activity. Aerobic activity should be performed in episodes of at least 10 minutes, and preferably, it should be spread throughout the week.  Behavioral modification strategies: increasing lean protein intake, increasing vegetables, meal planning and cooking strategies,  better snacking choices, and holiday eating strategies .  Annette Blevins has agreed to follow-up with our clinic in 5 weeks. She was informed of the importance of frequent follow-up visits to maximize her success with intensive lifestyle modifications for her multiple health conditions.   Objective:   VITALS: Per patient if applicable, see vitals. GENERAL: Alert and in no acute distress. CARDIOPULMONARY: No increased WOB. Speaking in clear sentences.  PSYCH: Pleasant and  cooperative. Speech normal rate and rhythm. Affect is appropriate. Insight and judgement are appropriate. Attention is focused, linear, and appropriate.  NEURO: Oriented as arrived to appointment on time with no prompting.   Lab Results  Component Value Date   CREATININE 0.85 03/30/2023   BUN 9 03/30/2023   NA 138 03/30/2023   K 4.2 03/30/2023   CL 102 03/30/2023   CO2 22 03/30/2023   Lab Results  Component Value Date   ALT 14 03/30/2023   AST 16 03/30/2023   ALKPHOS 85 03/30/2023   BILITOT 0.3 03/30/2023   Lab Results  Component Value Date   HGBA1C 5.2 03/30/2023   HGBA1C 5.4 11/30/2022   HGBA1C 5.6 06/25/2022   HGBA1C 5.3 02/01/2020   Lab Results  Component Value Date   INSULIN 16.4 03/30/2023   INSULIN 8.6 11/30/2022   INSULIN 12.3 06/25/2022   INSULIN 21.5 01/20/2022   Lab Results  Component Value Date   TSH 2.200 01/20/2022   Lab Results  Component Value Date   CHOL 241 (H) 03/30/2023   HDL 65 03/30/2023   LDLCALC 154 (H) 03/30/2023   TRIG 124 03/30/2023   CHOLHDL 4.2 11/30/2022   Lab Results  Component Value Date   VD25OH 28.1 (L) 03/30/2023   VD25OH 47.9 11/30/2022   VD25OH 26.9 (L) 06/25/2022   Lab Results  Component Value Date   WBC 9.0 05/15/2020   HGB 14.2 05/15/2020   HCT 44.3 05/15/2020   MCV 87.4 05/15/2020   PLT 382 05/15/2020   No results found for: "IRON", "TIBC", "FERRITIN"  Attestation Statements:   Reviewed by clinician on day of visit: allergies, medications, problem list, medical history, surgical history, family history, social history, and previous encounter notes.  Reuben Likes, MD

## 2023-05-04 NOTE — Assessment & Plan Note (Signed)
LDL still significantly elevated even with weight loss, dietary changes and activity increase.  Patient reports a significant family history of HLD and CAD.  Will start on statin- may want to refer to lipid clinic pending cholesterols response to statin.

## 2023-05-04 NOTE — Assessment & Plan Note (Signed)
Symptom well managed with PRN zofran.  Patient needs refill today.  Discussed possible side effect of constipation to watch out  for.

## 2023-05-04 NOTE — Assessment & Plan Note (Signed)
Most recent VIt D from last office visit still significantly below goal. Will refill Vit D.  Repeat lab in 3-4 months.  If still low will transition Vit D to D2.

## 2023-05-04 NOTE — Assessment & Plan Note (Signed)
Doing well on Wegovy 2.4mg .  Needs refill today.  Will send in 2 months as patient is scheduled 5 weeks out due to provider availability.

## 2023-05-07 ENCOUNTER — Other Ambulatory Visit (HOSPITAL_COMMUNITY): Payer: Self-pay

## 2023-05-18 ENCOUNTER — Other Ambulatory Visit (HOSPITAL_COMMUNITY): Payer: Self-pay

## 2023-06-02 ENCOUNTER — Other Ambulatory Visit (HOSPITAL_COMMUNITY): Payer: Self-pay

## 2023-06-14 ENCOUNTER — Encounter (INDEPENDENT_AMBULATORY_CARE_PROVIDER_SITE_OTHER): Payer: Self-pay | Admitting: Family Medicine

## 2023-06-14 ENCOUNTER — Other Ambulatory Visit (HOSPITAL_COMMUNITY): Payer: Self-pay

## 2023-06-14 ENCOUNTER — Ambulatory Visit (INDEPENDENT_AMBULATORY_CARE_PROVIDER_SITE_OTHER): Payer: 59 | Admitting: Family Medicine

## 2023-06-14 VITALS — BP 124/81 | HR 98 | Temp 98.4°F | Ht 68.0 in | Wt 156.0 lb

## 2023-06-14 DIAGNOSIS — E66811 Obesity, class 1: Secondary | ICD-10-CM

## 2023-06-14 DIAGNOSIS — E559 Vitamin D deficiency, unspecified: Secondary | ICD-10-CM | POA: Diagnosis not present

## 2023-06-14 DIAGNOSIS — E7849 Other hyperlipidemia: Secondary | ICD-10-CM

## 2023-06-14 DIAGNOSIS — Z6834 Body mass index (BMI) 34.0-34.9, adult: Secondary | ICD-10-CM

## 2023-06-14 DIAGNOSIS — Z6823 Body mass index (BMI) 23.0-23.9, adult: Secondary | ICD-10-CM | POA: Diagnosis not present

## 2023-06-14 MED ORDER — VITAMIN D (ERGOCALCIFEROL) 1.25 MG (50000 UNIT) PO CAPS
50000.0000 [IU] | ORAL_CAPSULE | ORAL | 0 refills | Status: DC
  Filled 2023-06-14: qty 5, 35d supply, fill #0
  Filled 2023-07-07: qty 4, 28d supply, fill #0

## 2023-06-14 MED ORDER — WEGOVY 2.4 MG/0.75ML ~~LOC~~ SOAJ
2.4000 mg | SUBCUTANEOUS | 1 refills | Status: DC
  Filled 2023-06-14 – 2023-07-07 (×2): qty 3, 28d supply, fill #0

## 2023-06-14 NOTE — Progress Notes (Signed)
SUBJECTIVE:  Chief Complaint: Obesity  Interim History: Patient had a great holiday season.  She has been scheduling numerous self care activities and finds herself being able to stay consistent. She has been able to be consistent with meal plan and exercise.  What has been really helping her recently is reigning in her eating out and also went and stocked up at [facility] yesterday.   Annette Blevins is here to discuss her progress with her obesity treatment plan. She is on the Category 3 Plan and states she is following her eating plan approximately 75-80 % of the time. She states she is exercising 45 minutes 4 times per week.   OBJECTIVE: Visit Diagnoses: Problem List Items Addressed This Visit       Other   Vitamin D deficiency - Primary   Prescription strength vitamin D refilled today for 1 month.  She will need a repeat level to assess if she is a metabolizer of D3 or if she needs to be transition to another formulation of vitamin D in order to get her levels closer to goal.  She denies nausea, vomiting, muscle weakness.      Relevant Medications   Vitamin D, Ergocalciferol, (DRISDOL) 1.25 MG (50000 UNIT) CAPS capsule   Other hyperlipidemia   Last LDL elevated at over 150.  Patient is on statin given her significant family history of cardiovascular disease.  She is hopeful that when she has repeat labs done in the next month and a half her LDL will have significant improvement.  She does not need a refill of her medication today.      Class 1 obesity with body mass index (BMI) of 34.0 to 34.9 in adult   Starting weight of 227 pounds on January 20, 2022.  Patient is currently at 156 pounds with a BMI of 23.7.  She and I talked at length today about what maintenance looks like for her.  She we will continue with her physical activity and her dietary changes as she reports this has become her new norm.  We talked about possibly spacing out the [facility] to every 10 days and that some people can even  tolerate spacing up to 2 weeks.  She mentions that she may consider increasing time interval between dosages between now and her next appointment.  She is noticing a difference as the medication wears off throughout the week though.  Will follow-up at next appointment to discuss length of time between injections.      Relevant Medications   Semaglutide-Weight Management (WEGOVY) 2.4 MG/0.75ML SOAJ   Other Visit Diagnoses       BMI 23.0-23.9, adult           No data recorded  No data recorded  No data recorded  No data recorded    ASSESSMENT AND PLAN:  Diet: Annis is currently in the action stage of change. As such, her goal is to continue with weight loss efforts. She has agreed to Category 3 Plan.  Exercise: [name] has been instructed to continue exercising as is for weight loss and overall health benefits.   Behavior Modification:  We discussed the following Behavioral Modification Strategies today: increasing lean protein intake, increasing vegetables, meal planning and cooking strategies, and better snacking choices. We discussed various medication options to help [name] with her weight loss efforts and we both agreed to continue wegovy as prescribed at 2.4mg  weekly.  No follow-ups on file.[name] She was informed of the importance of frequent follow up visits  to maximize her success with intensive lifestyle modifications for her multiple health conditions.  Attestation Statements:   Reviewed by clinician on day of visit: allergies, medications, problem list, medical history, surgical history, family history, social history, and previous encounter notes.      Reuben Likes, MD

## 2023-06-19 ENCOUNTER — Other Ambulatory Visit (HOSPITAL_COMMUNITY): Payer: Self-pay

## 2023-06-20 DIAGNOSIS — Z6834 Body mass index (BMI) 34.0-34.9, adult: Secondary | ICD-10-CM | POA: Insufficient documentation

## 2023-06-20 NOTE — Assessment & Plan Note (Signed)
Last LDL elevated at over 150.  Patient is on statin given her significant family history of cardiovascular disease.  She is hopeful that when she has repeat labs done in the next month and a half her LDL will have significant improvement.  She does not need a refill of her medication today.

## 2023-06-20 NOTE — Assessment & Plan Note (Signed)
Prescription strength vitamin D refilled today for 1 month.  She will need a repeat level to assess if she is a metabolizer of D3 or if she needs to be transition to another formulation of vitamin D in order to get her levels closer to goal.  She denies nausea, vomiting, muscle weakness.

## 2023-06-20 NOTE — Assessment & Plan Note (Signed)
Starting weight of 227 pounds on January 20, 2022.  Patient is currently at 156 pounds with a BMI of 23.7.  She and I talked at length today about what maintenance looks like for her.  She we will continue with her physical activity and her dietary changes as she reports this has become her new norm.  We talked about possibly spacing out the [facility] to every 10 days and that some people can even tolerate spacing up to 2 weeks.  She mentions that she may consider increasing time interval between dosages between now and her next appointment.  She is noticing a difference as the medication wears off throughout the week though.  Will follow-up at next appointment to discuss length of time between injections.

## 2023-06-25 ENCOUNTER — Other Ambulatory Visit (HOSPITAL_COMMUNITY): Payer: Self-pay

## 2023-07-07 ENCOUNTER — Other Ambulatory Visit (HOSPITAL_COMMUNITY): Payer: Self-pay

## 2023-07-12 ENCOUNTER — Other Ambulatory Visit: Payer: Self-pay

## 2023-07-21 ENCOUNTER — Telehealth (INDEPENDENT_AMBULATORY_CARE_PROVIDER_SITE_OTHER): Payer: Self-pay | Admitting: Family Medicine

## 2023-07-21 ENCOUNTER — Other Ambulatory Visit (INDEPENDENT_AMBULATORY_CARE_PROVIDER_SITE_OTHER): Payer: Self-pay

## 2023-07-21 DIAGNOSIS — Z6834 Body mass index (BMI) 34.0-34.9, adult: Secondary | ICD-10-CM

## 2023-07-21 MED ORDER — WEGOVY 2.4 MG/0.75ML ~~LOC~~ SOAJ: 2.4000 mg | SUBCUTANEOUS | 1 refills | Status: DC

## 2023-07-21 NOTE — Telephone Encounter (Signed)
 Patient called in to change her appointment, she needs an AM appointment due to fasting labs. We had to move the visit to Dr. Angus Palms next available morning appointment which isn't until 08/12/23  Therefore she stated she will run out of her Wegovy by that date. Patient requesting  a refill for her [facility]

## 2023-07-21 NOTE — Telephone Encounter (Signed)
 Prescription sent to the [facility]

## 2023-07-22 ENCOUNTER — Other Ambulatory Visit (HOSPITAL_COMMUNITY): Payer: Self-pay

## 2023-07-23 ENCOUNTER — Other Ambulatory Visit (HOSPITAL_COMMUNITY): Payer: Self-pay

## 2023-07-23 MED ORDER — LAMOTRIGINE 100 MG PO TABS
100.0000 mg | ORAL_TABLET | Freq: Every day | ORAL | 0 refills | Status: DC
  Filled 2023-07-23: qty 30, 30d supply, fill #0
  Filled 2023-08-22: qty 30, 30d supply, fill #1
  Filled 2023-10-01: qty 30, 30d supply, fill #2

## 2023-07-23 MED ORDER — SERTRALINE HCL 50 MG PO TABS
75.0000 mg | ORAL_TABLET | Freq: Every day | ORAL | 0 refills | Status: DC
Start: 2023-07-20 — End: 2024-03-02
  Filled 2023-07-23: qty 45, 30d supply, fill #0
  Filled 2023-09-03: qty 45, 30d supply, fill #1
  Filled 2023-10-15 – 2023-11-30 (×2): qty 45, 30d supply, fill #2

## 2023-08-05 ENCOUNTER — Other Ambulatory Visit: Payer: Self-pay

## 2023-08-05 ENCOUNTER — Other Ambulatory Visit (INDEPENDENT_AMBULATORY_CARE_PROVIDER_SITE_OTHER): Payer: Self-pay | Admitting: Family Medicine

## 2023-08-05 DIAGNOSIS — E7849 Other hyperlipidemia: Secondary | ICD-10-CM

## 2023-08-07 ENCOUNTER — Other Ambulatory Visit (INDEPENDENT_AMBULATORY_CARE_PROVIDER_SITE_OTHER): Payer: Self-pay | Admitting: Family Medicine

## 2023-08-07 DIAGNOSIS — E7849 Other hyperlipidemia: Secondary | ICD-10-CM

## 2023-08-09 ENCOUNTER — Ambulatory Visit (INDEPENDENT_AMBULATORY_CARE_PROVIDER_SITE_OTHER): Payer: 59 | Admitting: Family Medicine

## 2023-08-12 ENCOUNTER — Encounter (INDEPENDENT_AMBULATORY_CARE_PROVIDER_SITE_OTHER): Payer: Self-pay | Admitting: Family Medicine

## 2023-08-12 ENCOUNTER — Ambulatory Visit (INDEPENDENT_AMBULATORY_CARE_PROVIDER_SITE_OTHER): Admitting: Family Medicine

## 2023-08-12 ENCOUNTER — Other Ambulatory Visit (HOSPITAL_COMMUNITY): Payer: Self-pay

## 2023-08-12 VITALS — BP 91/57 | HR 110 | Temp 98.0°F | Ht 68.0 in | Wt 149.0 lb

## 2023-08-12 DIAGNOSIS — E7849 Other hyperlipidemia: Secondary | ICD-10-CM

## 2023-08-12 DIAGNOSIS — R5383 Other fatigue: Secondary | ICD-10-CM | POA: Diagnosis not present

## 2023-08-12 DIAGNOSIS — E559 Vitamin D deficiency, unspecified: Secondary | ICD-10-CM

## 2023-08-12 DIAGNOSIS — E66811 Obesity, class 1: Secondary | ICD-10-CM

## 2023-08-12 DIAGNOSIS — Z6834 Body mass index (BMI) 34.0-34.9, adult: Secondary | ICD-10-CM

## 2023-08-12 DIAGNOSIS — Z6822 Body mass index (BMI) 22.0-22.9, adult: Secondary | ICD-10-CM

## 2023-08-12 DIAGNOSIS — R7303 Prediabetes: Secondary | ICD-10-CM

## 2023-08-12 MED ORDER — VITAMIN D (ERGOCALCIFEROL) 1.25 MG (50000 UNIT) PO CAPS
50000.0000 [IU] | ORAL_CAPSULE | ORAL | 0 refills | Status: DC
  Filled 2023-08-12: qty 5, 35d supply, fill #0

## 2023-08-12 MED ORDER — WEGOVY 2.4 MG/0.75ML ~~LOC~~ SOAJ
2.4000 mg | SUBCUTANEOUS | 0 refills | Status: DC
Start: 2023-08-12 — End: 2024-01-04
  Filled 2023-08-12: qty 3, 28d supply, fill #0
  Filled 2023-09-20: qty 3, 28d supply, fill #1
  Filled 2023-10-15 – 2023-10-20 (×2): qty 3, 28d supply, fill #2

## 2023-08-12 MED ORDER — ATORVASTATIN CALCIUM 10 MG PO TABS
10.0000 mg | ORAL_TABLET | Freq: Every day | ORAL | 0 refills | Status: DC
  Filled 2023-08-12: qty 30, 30d supply, fill #0
  Filled 2023-09-20: qty 30, 30d supply, fill #1
  Filled 2023-10-15 – 2023-10-20 (×2): qty 30, 30d supply, fill #2

## 2023-08-12 NOTE — Assessment & Plan Note (Signed)
 On atorvastatin daily.  No transaminitis or myalgias.  Needs refill of lipitor and will recheck FLP today.

## 2023-08-12 NOTE — Assessment & Plan Note (Signed)
 Feeling significant fatigue especially around menstruation.  Will order CBC and Anemia panel to check for iron deficiency and blood loss anemia.

## 2023-08-12 NOTE — Assessment & Plan Note (Signed)
 On GLP1 therapy and working on dietary intake modification to be higher protein.  Will order CMP, A1c and Insulin level today.

## 2023-08-12 NOTE — Progress Notes (Signed)
 SUBJECTIVE:  Chief Complaint: Obesity  Interim History: Patient has mostly been focusing on school and getting thru the semester and spending time with friends.  Foodwise she has felt she has introduced too much snacking.  Not sure if the snacking is due to hunger or just because the food is there. She has been able to get all the food in on the meal plan.  She also found the strawberry flavored protein shake and has really enjoyed that.  No upcoming birthday plans but is planning to visit a friend in [place] in May.    Annette Blevins is here to discuss her progress with her obesity treatment plan. She is on the Category 3 Plan and states she is following her eating plan approximately 75 % of the time. She states she is exercising 3 hours per week.   OBJECTIVE: Visit Diagnoses: Problem List Items Addressed This Visit       Other   Other fatigue   Feeling significant fatigue especially around menstruation.  Will order CBC and Anemia panel to check for iron deficiency and blood loss anemia.      Relevant Orders   CBC w/Diff/Platelet   Anemia panel   Vitamin D deficiency - Primary   Previously on prescription strength Vitamin D.  Significant fatigue so will recheck level today.  Needs refill of rx today.      Relevant Medications   Vitamin D, Ergocalciferol, (DRISDOL) 1.25 MG (50000 UNIT) CAPS capsule   Other Relevant Orders   VITAMIN D 25 Hydroxy (Vit-D Deficiency, Fractures)   Pre-diabetes   On GLP1 therapy and working on dietary intake modification to be higher protein.  Will order CMP, A1c and Insulin level today.      Other hyperlipidemia   On atorvastatin daily.  No transaminitis or myalgias.  Needs refill of lipitor and will recheck FLP today.      Relevant Medications   atorvastatin (LIPITOR) 10 MG tablet   Other Relevant Orders   Lipid Panel With LDL/HDL Ratio   Class 1 obesity with body mass index (BMI) of 34.0 to 34.9 in adult   Relevant Medications    Semaglutide-Weight Management (WEGOVY) 2.4 MG/0.75ML SOAJ   Other Visit Diagnoses       Prediabetes       Relevant Orders   Comprehensive metabolic panel with GFR   Hemoglobin A1c   Insulin, random     BMI 22.0-22.9, adult           Vitals Temp: 98 F (36.7 C) BP: (!) 91/57 Pulse Rate: (!) 110 SpO2: 97 %   Anthropometric Measurements Height: 5\' 8"  (1.727 m) Weight: 149 lb (67.6 kg) BMI (Calculated): 22.66 Weight at Last Visit: 156 lb Weight Lost Since Last Visit: 7 lb Weight Gained Since Last Visit: 0 Starting Weight: 227 lb Total Weight Loss (lbs): 78 lb (35.4 kg)   Body Composition  Body Fat %: 29.8 % Fat Mass (lbs): 44.6 lbs Muscle Mass (lbs): 99.4 lbs Total Body Water (lbs): 71 lbs Visceral Fat Rating : 2   Other Clinical Data Fasting: yes Labs: yes Today's Visit #: 22 Starting Date: 01/20/22 Comments: Cat 3     ASSESSMENT AND PLAN:  Diet: Annette Blevins is currently in the action stage of change. As such, her goal is to continue with weight loss efforts and has agreed to the Category 3 Plan.   Exercise:  For additional and more extensive health benefits, adults should increase their aerobic physical activity to 300 minutes (5  hours) a week of moderate-intensity, or 150 minutes a week of vigorous-intensity aerobic physical activity, or an equivalent combination of moderate- and vigorous-intensity activity. Additional health benefits are gained by engaging in physical activity beyond this amount.   Behavior Modification:  We discussed the following Behavioral Modification Strategies today: increasing lean protein intake, increasing vegetables, meal planning and cooking strategies, keeping healthy foods in the home, and better snacking choices. We discussed various medication options to help Annette Blevins with her weight loss efforts and we both agreed to continue Wegovy at current dose.  Return in about 3 months (around 11/12/2023).[name] She was informed of the importance  of frequent follow up visits to maximize her success with intensive lifestyle modifications for her multiple health conditions.  Attestation Statements:   Reviewed by clinician on day of visit: allergies, medications, problem list, medical history, surgical history, family history, social history, and previous encounter notes.     Reuben Likes, MD

## 2023-08-12 NOTE — Assessment & Plan Note (Signed)
 Previously on prescription strength Vitamin D.  Significant fatigue so will recheck level today.  Needs refill of rx today.

## 2023-08-13 LAB — COMPREHENSIVE METABOLIC PANEL WITH GFR
ALT: 19 IU/L (ref 0–32)
AST: 23 IU/L (ref 0–40)
Albumin: 4.2 g/dL (ref 4.0–5.0)
Alkaline Phosphatase: 83 IU/L (ref 44–121)
BUN/Creatinine Ratio: 12 (ref 9–23)
BUN: 10 mg/dL (ref 6–20)
Bilirubin Total: <0.2 mg/dL (ref 0.0–1.2)
CO2: 23 mmol/L (ref 20–29)
Calcium: 9.6 mg/dL (ref 8.7–10.2)
Chloride: 104 mmol/L (ref 96–106)
Creatinine, Ser: 0.81 mg/dL (ref 0.57–1.00)
Globulin, Total: 2.6 g/dL (ref 1.5–4.5)
Glucose: 79 mg/dL (ref 70–99)
Potassium: 4.1 mmol/L (ref 3.5–5.2)
Sodium: 140 mmol/L (ref 134–144)
Total Protein: 6.8 g/dL (ref 6.0–8.5)
eGFR: 103 mL/min/{1.73_m2} (ref >59)

## 2023-08-13 LAB — HEMOGLOBIN A1C
Est. average glucose Bld gHb Est-mCnc: 103 mg/dL
Hgb A1c MFr Bld: 5.2 % (ref 4.8–5.6)

## 2023-08-13 LAB — LIPID PANEL WITH LDL/HDL RATIO
Cholesterol, Total: 223 mg/dL — ABNORMAL HIGH (ref 100–199)
HDL: 66 mg/dL (ref >39)
LDL Chol Calc (NIH): 142 mg/dL — ABNORMAL HIGH (ref 0–99)
LDL/HDL Ratio: 2.2 ratio (ref 0.0–3.2)
Triglycerides: 84 mg/dL (ref 0–149)
VLDL Cholesterol Cal: 15 mg/dL (ref 5–40)

## 2023-08-13 LAB — CBC WITH DIFFERENTIAL/PLATELET
Basophils Absolute: 0 10*3/uL (ref 0.0–0.2)
Basos: 1 %
EOS (ABSOLUTE): 0 10*3/uL (ref 0.0–0.4)
Eos: 1 %
Hemoglobin: 14.6 g/dL (ref 11.1–15.9)
Immature Grans (Abs): 0 10*3/uL (ref 0.0–0.1)
Immature Granulocytes: 0 %
Lymphocytes Absolute: 1.8 10*3/uL (ref 0.7–3.1)
Lymphs: 57 %
MCH: 29 pg (ref 26.6–33.0)
MCHC: 32.7 g/dL (ref 31.5–35.7)
MCV: 89 fL (ref 79–97)
Monocytes Absolute: 0.4 10*3/uL (ref 0.1–0.9)
Monocytes: 12 %
Neutrophils Absolute: 0.9 10*3/uL — ABNORMAL LOW (ref 1.4–7.0)
Neutrophils: 29 %
Platelets: 214 10*3/uL (ref 150–450)
RBC: 5.03 x10E6/uL (ref 3.77–5.28)
RDW: 13.1 % (ref 11.7–15.4)
WBC: 3.2 10*3/uL — ABNORMAL LOW (ref 3.4–10.8)

## 2023-08-13 LAB — ANEMIA PANEL
Ferritin: 228 ng/mL — ABNORMAL HIGH (ref 15–150)
Folate, Hemolysate: 318 ng/mL
Folate, RBC: 713 ng/mL (ref >498)
Hematocrit: 44.6 % (ref 34.0–46.6)
Iron Saturation: 16 % (ref 15–55)
Iron: 55 ug/dL (ref 27–159)
Retic Ct Pct: 1.2 % (ref 0.6–2.6)
Total Iron Binding Capacity: 344 ug/dL (ref 250–450)
UIBC: 289 ug/dL (ref 131–425)
Vitamin B-12: 366 pg/mL (ref 232–1245)

## 2023-08-13 LAB — INSULIN, RANDOM: INSULIN: 12.3 u[IU]/mL (ref 2.6–24.9)

## 2023-08-13 LAB — VITAMIN D 25 HYDROXY (VIT D DEFICIENCY, FRACTURES): Vit D, 25-Hydroxy: 42 ng/mL (ref 30.0–100.0)

## 2023-08-16 ENCOUNTER — Encounter (INDEPENDENT_AMBULATORY_CARE_PROVIDER_SITE_OTHER): Payer: Self-pay

## 2023-08-17 ENCOUNTER — Other Ambulatory Visit (HOSPITAL_COMMUNITY): Payer: Self-pay

## 2023-08-24 ENCOUNTER — Other Ambulatory Visit (HOSPITAL_COMMUNITY): Payer: Self-pay

## 2023-09-01 ENCOUNTER — Other Ambulatory Visit (HOSPITAL_COMMUNITY): Payer: Self-pay

## 2023-09-03 ENCOUNTER — Other Ambulatory Visit: Payer: Self-pay

## 2023-09-21 ENCOUNTER — Other Ambulatory Visit (HOSPITAL_COMMUNITY): Payer: Self-pay

## 2023-09-25 ENCOUNTER — Other Ambulatory Visit (HOSPITAL_COMMUNITY): Payer: Self-pay

## 2023-10-01 ENCOUNTER — Other Ambulatory Visit: Payer: Self-pay

## 2023-10-19 ENCOUNTER — Other Ambulatory Visit (HOSPITAL_COMMUNITY): Payer: Self-pay

## 2023-10-19 ENCOUNTER — Other Ambulatory Visit (HOSPITAL_BASED_OUTPATIENT_CLINIC_OR_DEPARTMENT_OTHER): Payer: Self-pay

## 2023-10-19 MED ORDER — LAMOTRIGINE 100 MG PO TABS
100.0000 mg | ORAL_TABLET | Freq: Every day | ORAL | 0 refills | Status: DC
  Filled 2023-10-19 – 2023-11-02 (×2): qty 90, 90d supply, fill #0

## 2023-10-19 MED ORDER — SERTRALINE HCL 50 MG PO TABS
75.0000 mg | ORAL_TABLET | Freq: Every day | ORAL | 0 refills | Status: DC
  Filled 2023-10-19: qty 45, 30d supply, fill #0
  Filled 2023-12-31: qty 45, 30d supply, fill #1

## 2023-10-20 ENCOUNTER — Other Ambulatory Visit (HOSPITAL_COMMUNITY): Payer: Self-pay

## 2023-11-02 ENCOUNTER — Other Ambulatory Visit (HOSPITAL_COMMUNITY): Payer: Self-pay

## 2023-11-10 ENCOUNTER — Ambulatory Visit (INDEPENDENT_AMBULATORY_CARE_PROVIDER_SITE_OTHER): Admitting: Family Medicine

## 2023-11-10 ENCOUNTER — Encounter (INDEPENDENT_AMBULATORY_CARE_PROVIDER_SITE_OTHER): Payer: Self-pay | Admitting: Family Medicine

## 2023-11-10 ENCOUNTER — Other Ambulatory Visit (HOSPITAL_COMMUNITY): Payer: Self-pay

## 2023-11-10 VITALS — BP 117/73 | HR 77 | Temp 98.2°F | Ht 68.0 in | Wt 144.0 lb

## 2023-11-10 DIAGNOSIS — Z6834 Body mass index (BMI) 34.0-34.9, adult: Secondary | ICD-10-CM

## 2023-11-10 DIAGNOSIS — Z6821 Body mass index (BMI) 21.0-21.9, adult: Secondary | ICD-10-CM

## 2023-11-10 DIAGNOSIS — E66811 Obesity, class 1: Secondary | ICD-10-CM | POA: Diagnosis not present

## 2023-11-10 DIAGNOSIS — E7849 Other hyperlipidemia: Secondary | ICD-10-CM

## 2023-11-10 DIAGNOSIS — R110 Nausea: Secondary | ICD-10-CM | POA: Diagnosis not present

## 2023-11-10 DIAGNOSIS — E559 Vitamin D deficiency, unspecified: Secondary | ICD-10-CM | POA: Diagnosis not present

## 2023-11-10 MED ORDER — WEGOVY 1.7 MG/0.75ML ~~LOC~~ SOAJ
1.7000 mg | SUBCUTANEOUS | 0 refills | Status: DC
  Filled 2023-11-10: qty 3, 28d supply, fill #0
  Filled 2023-12-14: qty 3, 28d supply, fill #1
  Filled 2024-02-10: qty 3, 28d supply, fill #2

## 2023-11-10 MED ORDER — ATORVASTATIN CALCIUM 20 MG PO TABS
20.0000 mg | ORAL_TABLET | Freq: Every day | ORAL | 0 refills | Status: DC
  Filled 2023-11-10: qty 30, 30d supply, fill #0
  Filled 2023-12-14: qty 30, 30d supply, fill #1
  Filled 2024-01-21: qty 30, 30d supply, fill #2

## 2023-11-10 MED ORDER — VITAMIN D (ERGOCALCIFEROL) 1.25 MG (50000 UNIT) PO CAPS
50000.0000 [IU] | ORAL_CAPSULE | ORAL | 0 refills | Status: DC
  Filled 2023-11-10: qty 4, 28d supply, fill #0
  Filled 2023-12-14: qty 4, 28d supply, fill #1

## 2023-11-10 NOTE — Progress Notes (Signed)
 SUBJECTIVE:  Chief Complaint: Obesity  Interim History: Since last appointment patient has really enjoyed life.  She has been staying active and trying to do stuff active with friends.  She is planning to quit her job soon and is planning to join her husbands Oncologist.  Not planning much for the summer except maybe going kayaking.  She is setting up to eat the same lunch everyday and that has helped quiet some of the internal food noise.  Annette Blevins is here to discuss her progress with her obesity treatment plan. She is on the keeping a food journal and adhering to recommended goals of 1800 calories and 80-90 protein and states she is following her eating plan approximately 85 % of the time. She states she is exercising walking and weights 30 minutes 3 times per week.   OBJECTIVE: Visit Diagnoses: Problem List Items Addressed This Visit       Other   Vitamin D  deficiency   Previously on prescription strength Vitamin D .   Needs refill of rx today.      Relevant Medications   Vitamin D , Ergocalciferol , (DRISDOL ) 1.25 MG (50000 UNIT) CAPS capsule   Nausea   Occasional nausea with treatment on GLP1.  No refill of medication for treatment at this time.      Other hyperlipidemia - Primary   Patient tolerating statin 20mg  daily.  Needs a refill today- will need repeat labs in next 2 months.      Relevant Medications   atorvastatin  (LIPITOR) 20 MG tablet   Class 1 obesity with body mass index (BMI) of 34.0 to 34.9 in adult   Anthropometric Measurements Height: 5' 8 (1.727 m) Weight: 144 lb (65.3 kg) BMI (Calculated): 21.9 Weight at Last Visit: 149 lb Weight Lost Since Last Visit: 5 lb Weight Gained Since Last Visit: 0 Starting Weight: 227 lb Total Weight Loss (lbs): 83 lb (37.6 kg) Peak Weight: 245 lb Body Composition  Body Fat %: 27.2 % Fat Mass (lbs): 39.4 lbs Muscle Mass (lbs): 99.8 lbs Total Body Water (lbs): 73.6 lbs Visceral Fat Rating : 2 Other Clinical  Data Fasting: no Labs: no Today's Visit #: 23 Starting Date: 01/20/22       Relevant Medications   Semaglutide -Weight Management (WEGOVY ) 1.7 MG/0.75ML SOAJ   Other Visit Diagnoses       BMI 21.0-21.9, adult           No data recorded       11/10/2023    4:00 PM 11/10/2023    3:00 PM 08/12/2023    9:00 AM  Vitals with BMI  Height  5' 8 5' 8  Weight  144 lbs 149 lbs  BMI  21.9 22.66  Systolic 117 89 91  Diastolic 73 58 57  Pulse  77 110      ASSESSMENT AND PLAN:  Diet: Abrial is currently in the action stage of change. As such, her goal is to continue with weight loss efforts and has agreed to the Category 3 Plan.   Exercise:  For additional and more extensive health benefits, adults should increase their aerobic physical activity to 300 minutes (5 hours) a week of moderate-intensity, or 150 minutes a week of vigorous-intensity aerobic physical activity, or an equivalent combination of moderate- and vigorous-intensity activity. Additional health benefits are gained by engaging in physical activity beyond this amount.   Behavior Modification:  We discussed the following Behavioral Modification Strategies today: increasing lean protein intake, decreasing simple carbohydrates, increasing vegetables,  avoiding temptations, and planning for success. We discussed various medication options to help Jamisyn with her weight loss efforts and we both agreed to decrease wegovy  to 1.7mg  weekly.  Return in about 8 weeks (around 01/05/2024).   She was informed of the importance of frequent follow up visits to maximize her success with intensive lifestyle modifications for her multiple health conditions.  Attestation Statements:   Reviewed by clinician on day of visit: allergies, medications, problem list, medical history, surgical history, family history, social history, and previous encounter notes.     Adelita Cho, MD

## 2023-11-11 ENCOUNTER — Other Ambulatory Visit (HOSPITAL_COMMUNITY): Payer: Self-pay

## 2023-11-20 NOTE — Assessment & Plan Note (Signed)
 Previously on prescription strength Vitamin D .   Needs refill of rx today.

## 2023-11-20 NOTE — Assessment & Plan Note (Signed)
 Anthropometric Measurements Height: 5' 8 (1.727 m) Weight: 144 lb (65.3 kg) BMI (Calculated): 21.9 Weight at Last Visit: 149 lb Weight Lost Since Last Visit: 5 lb Weight Gained Since Last Visit: 0 Starting Weight: 227 lb Total Weight Loss (lbs): 83 lb (37.6 kg) Peak Weight: 245 lb Body Composition  Body Fat %: 27.2 % Fat Mass (lbs): 39.4 lbs Muscle Mass (lbs): 99.8 lbs Total Body Water (lbs): 73.6 lbs Visceral Fat Rating : 2 Other Clinical Data Fasting: no Labs: no Today's Visit #: 23 Starting Date: 01/20/22

## 2023-11-20 NOTE — Assessment & Plan Note (Signed)
 Occasional nausea with treatment on GLP1.  No refill of medication for treatment at this time.

## 2023-11-20 NOTE — Assessment & Plan Note (Signed)
 Patient tolerating statin 20mg  daily.  Needs a refill today- will need repeat labs in next 2 months.

## 2023-12-13 ENCOUNTER — Telehealth (INDEPENDENT_AMBULATORY_CARE_PROVIDER_SITE_OTHER): Payer: Self-pay | Admitting: Family Medicine

## 2023-12-13 NOTE — Telephone Encounter (Signed)
 Patient called in stating herr Wegovy  1.7 and 1.0 needs to be refilled before her Insurance ends on 12/15/23. Please send RX to [facility]patient community pharmacy.

## 2023-12-14 ENCOUNTER — Other Ambulatory Visit (INDEPENDENT_AMBULATORY_CARE_PROVIDER_SITE_OTHER): Payer: Self-pay | Admitting: Family Medicine

## 2023-12-14 ENCOUNTER — Other Ambulatory Visit: Payer: Self-pay

## 2023-12-14 DIAGNOSIS — E559 Vitamin D deficiency, unspecified: Secondary | ICD-10-CM

## 2023-12-15 ENCOUNTER — Other Ambulatory Visit (HOSPITAL_COMMUNITY): Payer: Self-pay

## 2023-12-15 ENCOUNTER — Other Ambulatory Visit: Payer: Self-pay

## 2023-12-30 ENCOUNTER — Encounter (HOSPITAL_COMMUNITY): Payer: Self-pay

## 2023-12-30 ENCOUNTER — Other Ambulatory Visit (HOSPITAL_COMMUNITY): Payer: Self-pay

## 2024-01-04 ENCOUNTER — Ambulatory Visit (INDEPENDENT_AMBULATORY_CARE_PROVIDER_SITE_OTHER): Payer: Self-pay | Admitting: Family Medicine

## 2024-01-04 ENCOUNTER — Other Ambulatory Visit (HOSPITAL_COMMUNITY): Payer: Self-pay

## 2024-01-04 ENCOUNTER — Encounter (INDEPENDENT_AMBULATORY_CARE_PROVIDER_SITE_OTHER): Payer: Self-pay | Admitting: Family Medicine

## 2024-01-04 VITALS — BP 107/67 | HR 94 | Temp 98.2°F | Ht 68.0 in | Wt 143.0 lb

## 2024-01-04 DIAGNOSIS — Z6821 Body mass index (BMI) 21.0-21.9, adult: Secondary | ICD-10-CM

## 2024-01-04 DIAGNOSIS — E669 Obesity, unspecified: Secondary | ICD-10-CM

## 2024-01-04 DIAGNOSIS — E559 Vitamin D deficiency, unspecified: Secondary | ICD-10-CM

## 2024-01-04 DIAGNOSIS — E66811 Obesity, class 1: Secondary | ICD-10-CM

## 2024-01-04 DIAGNOSIS — R110 Nausea: Secondary | ICD-10-CM

## 2024-01-04 MED ORDER — VITAMIN D (ERGOCALCIFEROL) 1.25 MG (50000 UNIT) PO CAPS
50000.0000 [IU] | ORAL_CAPSULE | ORAL | 0 refills | Status: DC
  Filled 2024-01-04 – 2024-01-21 (×2): qty 5, 35d supply, fill #0

## 2024-01-04 MED ORDER — ONDANSETRON 8 MG PO TBDP
8.0000 mg | ORAL_TABLET | Freq: Three times a day (TID) | ORAL | 2 refills | Status: AC | PRN
  Filled 2024-01-04: qty 20, 7d supply, fill #0

## 2024-01-04 NOTE — Progress Notes (Signed)
 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  She has gotten out of the habit of making real dinners.  She has decreased her dose of wegovy  to the 1.7mg  and has been having more frequent bowel movements.  She also has been slightly more nauseous. Today is first day of school so she is planning on getting her schedule more under control.  She is anticipating on getting on a schedule of going to the gym with husband who will be getting back into teaching.   Khamryn is here to discuss her progress with her obesity treatment plan. She is on the Category 3 Plan and states she is following her eating plan approximately 80 % of the time. She states she is walking 120 minutes 4 times per week.   OBJECTIVE: Visit Diagnoses: Problem List Items Addressed This Visit       Other   Vitamin D  deficiency   Relevant Medications   Vitamin D , Ergocalciferol , (DRISDOL ) 1.25 MG (50000 UNIT) CAPS capsule   Nausea - Primary   Relevant Medications   ondansetron  (ZOFRAN -ODT) 8 MG disintegrating tablet   Class 1 obesity with body mass index (BMI) of 34.0 to 34.9 in adult   Other Visit Diagnoses       BMI 21.0-21.9, adult           Vitals Temp: 98.2 F (36.8 C) BP: 107/67 Pulse Rate: 94 SpO2: 98 %   Anthropometric Measurements Height: 5' 8 (1.727 m) Weight: 143 lb (64.9 kg) BMI (Calculated): 21.75 Weight at Last Visit: 144 lb Weight Lost Since Last Visit: 1 Weight Gained Since Last Visit: 0 Starting Weight: 227 lb Total Weight Loss (lbs): 84 lb (38.1 kg)   Body Composition  Body Fat %: 27.8 % Fat Mass (lbs): 39.8 lbs Muscle Mass (lbs): 98 lbs Total Body Water (lbs): 71 lbs Visceral Fat Rating : 2   Other Clinical Data Today's Visit #: 24 Starting Date: 01/20/22 Comments: Cat 3     ASSESSMENT AND  PLAN: Assessment & Plan Nausea Patient mentions some nausea with spacing out her GLP-1 medication.  Asking for refill of Zofran .  Refill sent to pharmacy.  We did discuss possible side effects which include constipation and headache.  Patient to notify me if she experiences either of these side effects for further treatment management at that time. Vitamin D  deficiency Last vitamin D  level slightly below goal.  Patient needs a refill of vitamin D  today.  Fatigue unchanged. Obesity with starting BMI of 34.5  BMI 21.0-21.9, adult    Diet: Darlette is currently in the action stage of change. As such, her goal is to continue with weight loss efforts and has agreed to the Category 3 Plan.   Exercise:  For additional and more extensive health benefits, adults should increase their aerobic physical activity to 300 minutes (5 hours) a week of moderate-intensity, or 150 minutes a week of vigorous-intensity aerobic physical activity, or an equivalent combination of moderate- and vigorous-intensity activity. Additional health benefits are gained by engaging in physical activity beyond this amount.  and Adults should also include muscle-strengthening activities that involve all major muscle groups on 2 or more days a week.  Behavior Modification:  We discussed the following Behavioral Modification Strategies today: increasing lean protein intake, decreasing simple carbohydrates, increasing vegetables, meal planning and cooking  strategies, and planning for success. We discussed various medication options to help Kismet with her weight loss efforts and we both agreed to continue wegovy  until current refill runs out.  Return in about 8 weeks (around 02/29/2024).   She was informed of the importance of frequent follow up visits to maximize her success with intensive lifestyle modifications for her multiple health conditions.  Attestation Statements:   Reviewed by clinician on day of visit: allergies,  medications, problem list, medical history, surgical history, family history, social history, and previous encounter notes.     Adelita Cho, MD

## 2024-01-05 ENCOUNTER — Ambulatory Visit (INDEPENDENT_AMBULATORY_CARE_PROVIDER_SITE_OTHER): Payer: Self-pay | Admitting: Family Medicine

## 2024-01-10 NOTE — Assessment & Plan Note (Signed)
 Patient mentions some nausea with spacing out her GLP-1 medication.  Asking for refill of Zofran .  Refill sent to pharmacy.  We did discuss possible side effects which include constipation and headache.  Patient to notify me if she experiences either of these side effects for further treatment management at that time.

## 2024-01-10 NOTE — Assessment & Plan Note (Signed)
 Last vitamin D  level slightly below goal.  Patient needs a refill of vitamin D  today.  Fatigue unchanged.

## 2024-01-12 ENCOUNTER — Other Ambulatory Visit (HOSPITAL_COMMUNITY): Payer: Self-pay

## 2024-01-12 MED ORDER — SERTRALINE HCL 100 MG PO TABS
100.0000 mg | ORAL_TABLET | Freq: Every day | ORAL | 2 refills | Status: DC
  Filled 2024-01-12: qty 30, 30d supply, fill #0

## 2024-01-12 MED ORDER — LAMOTRIGINE 100 MG PO TABS
100.0000 mg | ORAL_TABLET | Freq: Every day | ORAL | 2 refills | Status: DC
  Filled 2024-01-12: qty 30, 30d supply, fill #0
  Filled 2024-02-08: qty 30, 30d supply, fill #1

## 2024-01-14 ENCOUNTER — Other Ambulatory Visit (HOSPITAL_COMMUNITY): Payer: Self-pay

## 2024-01-21 ENCOUNTER — Other Ambulatory Visit (HOSPITAL_COMMUNITY): Payer: Self-pay

## 2024-01-22 ENCOUNTER — Other Ambulatory Visit (HOSPITAL_COMMUNITY): Payer: Self-pay

## 2024-01-24 ENCOUNTER — Other Ambulatory Visit: Payer: Self-pay

## 2024-01-25 ENCOUNTER — Other Ambulatory Visit (HOSPITAL_COMMUNITY): Payer: Self-pay

## 2024-01-25 MED ORDER — DROSPIRENONE-ETHINYL ESTRADIOL 3-0.02 MG PO TABS
1.0000 | ORAL_TABLET | Freq: Every day | ORAL | 0 refills | Status: DC
  Filled 2024-01-25: qty 84, 84d supply, fill #0

## 2024-02-11 ENCOUNTER — Other Ambulatory Visit: Payer: Self-pay

## 2024-02-20 ENCOUNTER — Other Ambulatory Visit (HOSPITAL_COMMUNITY): Payer: Self-pay

## 2024-02-20 MED ORDER — LAMOTRIGINE 100 MG PO TABS
100.0000 mg | ORAL_TABLET | Freq: Every day | ORAL | 2 refills | Status: DC
  Filled 2024-02-20 – 2024-03-11 (×2): qty 30, 30d supply, fill #0

## 2024-02-20 MED ORDER — SERTRALINE HCL 100 MG PO TABS
100.0000 mg | ORAL_TABLET | Freq: Every day | ORAL | 2 refills | Status: DC
  Filled 2024-02-20 – 2024-03-11 (×2): qty 30, 30d supply, fill #0
  Filled 2024-04-12: qty 30, 30d supply, fill #1

## 2024-02-21 ENCOUNTER — Other Ambulatory Visit (HOSPITAL_COMMUNITY): Payer: Self-pay

## 2024-03-02 ENCOUNTER — Ambulatory Visit (INDEPENDENT_AMBULATORY_CARE_PROVIDER_SITE_OTHER): Payer: Self-pay | Admitting: Family Medicine

## 2024-03-02 ENCOUNTER — Encounter (INDEPENDENT_AMBULATORY_CARE_PROVIDER_SITE_OTHER): Payer: Self-pay | Admitting: Family Medicine

## 2024-03-02 ENCOUNTER — Other Ambulatory Visit (HOSPITAL_COMMUNITY): Payer: Self-pay

## 2024-03-02 VITALS — BP 102/52 | HR 98 | Temp 98.4°F | Ht 68.0 in | Wt 138.0 lb

## 2024-03-02 DIAGNOSIS — E66811 Obesity, class 1: Secondary | ICD-10-CM

## 2024-03-02 DIAGNOSIS — E7849 Other hyperlipidemia: Secondary | ICD-10-CM

## 2024-03-02 DIAGNOSIS — Z6820 Body mass index (BMI) 20.0-20.9, adult: Secondary | ICD-10-CM

## 2024-03-02 MED ORDER — PHENTERMINE HCL 8 MG PO TABS: ORAL_TABLET | ORAL | 0 refills | Status: DC

## 2024-03-02 MED ORDER — TOPIRAMATE 50 MG PO TABS: ORAL_TABLET | ORAL | 0 refills | Status: DC

## 2024-03-02 NOTE — Assessment & Plan Note (Signed)
 Medication options were discussed extensively with patient today.  Given availability, cost, insurance coverage and other medical comorbidities the decision was reached to start medications Lomaira and Topiramate.  These medications will be used as a substitute for brand name Qsymia in doses that are synanomous.  Patient understands this is an off label usage.  We discussed the titration schedule with the goal of 5% weight loss at 3 months at a treatment dose.  The first two weeks will be a starting dose of 25mg  of Topiramate and 4mg  of Lomaira.  After two weeks the patient will increase to 50mg  of Topiramate and 8mg  of Lomaira and will stay on this dose until the next appointment.  Controlled substance contract was discussed and signed today.  Prescriptions sent in.

## 2024-03-02 NOTE — Progress Notes (Signed)
 SUBJECTIVE:  Chief Complaint: Obesity  Interim History: Patient here for routine follow up . She has been working a significant amount and going to school.  She is in her last year of college and has not been able to go to the gym since our last appointment. She is scheduled to the max.  She has 8 weeks left of her semester.  She has been eating and has been trying to remind herself to get more aligned with nutrition than indulgent food.   She is trying to get herself more consistent especially in times of stress.  She is trying to eat well balanced meals while at work.   Annette Blevins is here to discuss her progress with her obesity treatment plan. She is on the Category 3 Plan and states she is following her eating plan approximately 65 % of the time. She states she is walking 14-18 miles a week.   OBJECTIVE: Visit Diagnoses: Problem List Items Addressed This Visit       Other   Other hyperlipidemia - Primary   Class 1 obesity with body mass index (BMI) of 34.0 to 34.9 in adult   Medication options were discussed extensively with patient today.  Given availability, cost, insurance coverage and other medical comorbidities the decision was reached to start medications Lomaira  and Topiramate.  These medications will be used as a substitute for brand name Qsymia in doses that are synanomous.  Patient understands this is an off label usage.  We discussed the titration schedule with the goal of 5% weight loss at 3 months at a treatment dose.  The first two weeks will be a starting dose of 25mg  of Topiramate and 4mg  of Lomaira .  After two weeks the patient will increase to 50mg  of Topiramate and 8mg  of Lomaira  and will stay on this dose until the next appointment.  Controlled substance contract was discussed and signed today.  Prescriptions sent in.        Relevant Medications   Phentermine  HCl (LOMAIRA ) 8 MG TABS   topiramate (TOPAMAX) 50 MG tablet   Other Visit Diagnoses       BMI 20.0-20.9,  adult           Vitals Temp: 98.4 F (36.9 C) BP: (!) 102/52 Pulse Rate: 98 SpO2: 98 %   Anthropometric Measurements Height: 5' 8 (1.727 m) Weight: 138 lb (62.6 kg) BMI (Calculated): 20.99 Weight at Last Visit: 143 lb Weight Lost Since Last Visit: 5 Weight Gained Since Last Visit: 0 Starting Weight: 227 lb Total Weight Loss (lbs): 89 lb (40.4 kg)   Body Composition  Body Fat %: 33.6 % Fat Mass (lbs): 46.4 lbs Muscle Mass (lbs): 87 lbs Total Body Water (lbs): 66.6 lbs Visceral Fat Rating : 4   Other Clinical Data Today's Visit #: 25 Starting Date: 01/20/22 Comments: Cat 3     ASSESSMENT AND PLAN: Assessment & Plan Other hyperlipidemia Elevated cholesterol on last lab.  Patient reports that she would like to get a repeat cholesterol panel done early in the new year pending whether or not she has insurance.  We discussed possibility of paying out-of-pocket for lab draw using a website which allows for self ordering of labs.  Will follow-up on this at next appointment to make plans for labs in January. Class 1 obesity with serious comorbidity and body mass index (BMI) of 34.0 to 34.9 in adult, unspecified obesity type Medication options were discussed extensively with patient today.  Given availability, cost, insurance coverage  and other medical comorbidities the decision was reached to start medications Lomaira  and Topiramate.  These medications will be used as a substitute for brand name Qsymia in doses that are synanomous.  Patient understands this is an off label usage.  We discussed the titration schedule with the goal of 5% weight loss at 3 months at a treatment dose.  The first two weeks will be a starting dose of 25mg  of Topiramate and 4mg  of Lomaira .  After two weeks the patient will increase to 50mg  of Topiramate and 8mg  of Lomaira  and will stay on this dose until the next appointment.  Controlled substance contract was discussed and signed today.  Prescriptions  sent in.   BMI 20.0-20.9, adult    Diet: Annette Blevins is currently in the action stage of change. As such, her goal is to continue with weight loss efforts and has agreed to the Category 3 Plan.   Exercise:  For substantial health benefits, adults should do at least 150 minutes (2 hours and 30 minutes) a week of moderate-intensity, or 75 minutes (1 hour and 15 minutes) a week of vigorous-intensity aerobic physical activity, or an equivalent combination of moderate- and vigorous-intensity aerobic activity. Aerobic activity should be performed in episodes of at least 10 minutes, and preferably, it should be spread throughout the week.  Behavior Modification:  We discussed the following Behavioral Modification Strategies today: increasing lean protein intake, decreasing simple carbohydrates, increasing vegetables, meal planning and cooking strategies, and planning for success. We discussed various medication options to help Annette Blevins with her weight loss efforts and we both agreed to start phentermine  and topiramate as above. Return in about 4 weeks (around 03/30/2024).   She was informed of the importance of frequent follow up visits to maximize her success with intensive lifestyle modifications for her multiple health conditions.  Attestation Statements:   Reviewed by clinician on day of visit: allergies, medications, problem list, medical history, surgical history, family history, social history, and previous encounter notes.     Annette Cho, MD

## 2024-03-03 NOTE — Assessment & Plan Note (Signed)
 Elevated cholesterol on last lab.  Patient reports that she would like to get a repeat cholesterol panel done early in the new year pending whether or not she has insurance.  We discussed possibility of paying out-of-pocket for lab draw using a website which allows for self ordering of labs.  Will follow-up on this at next appointment to make plans for labs in January.

## 2024-03-11 ENCOUNTER — Other Ambulatory Visit (HOSPITAL_COMMUNITY): Payer: Self-pay

## 2024-03-11 ENCOUNTER — Other Ambulatory Visit (INDEPENDENT_AMBULATORY_CARE_PROVIDER_SITE_OTHER): Payer: Self-pay | Admitting: Family Medicine

## 2024-03-11 DIAGNOSIS — E7849 Other hyperlipidemia: Secondary | ICD-10-CM

## 2024-03-13 ENCOUNTER — Other Ambulatory Visit: Payer: Self-pay

## 2024-03-16 ENCOUNTER — Other Ambulatory Visit (HOSPITAL_COMMUNITY): Payer: Self-pay

## 2024-03-17 ENCOUNTER — Encounter (HOSPITAL_COMMUNITY): Payer: Self-pay

## 2024-03-17 ENCOUNTER — Other Ambulatory Visit (HOSPITAL_COMMUNITY): Payer: Self-pay

## 2024-03-27 ENCOUNTER — Other Ambulatory Visit (HOSPITAL_COMMUNITY): Payer: Self-pay

## 2024-03-27 MED ORDER — SERTRALINE HCL 50 MG PO TABS
50.0000 mg | ORAL_TABLET | Freq: Every day | ORAL | 1 refills | Status: AC
  Filled 2024-03-27 – 2024-05-15 (×2): qty 90, 90d supply, fill #0

## 2024-03-27 MED ORDER — DROSPIRENONE-ETHINYL ESTRADIOL 3-0.02 MG PO TABS
1.0000 | ORAL_TABLET | Freq: Every day | ORAL | 3 refills | Status: AC
  Filled 2024-03-27 – 2024-04-20 (×2): qty 84, 84d supply, fill #0
  Filled 2024-06-18: qty 84, 84d supply, fill #1

## 2024-03-27 MED ORDER — LAMOTRIGINE 100 MG PO TABS
100.0000 mg | ORAL_TABLET | Freq: Every day | ORAL | 1 refills | Status: AC
  Filled 2024-03-27 – 2024-05-05 (×2): qty 90, 90d supply, fill #0

## 2024-04-04 ENCOUNTER — Ambulatory Visit (INDEPENDENT_AMBULATORY_CARE_PROVIDER_SITE_OTHER): Payer: Self-pay | Admitting: Family Medicine

## 2024-04-10 ENCOUNTER — Other Ambulatory Visit (HOSPITAL_COMMUNITY): Payer: Self-pay

## 2024-04-20 ENCOUNTER — Other Ambulatory Visit (HOSPITAL_COMMUNITY): Payer: Self-pay

## 2024-05-01 ENCOUNTER — Encounter (INDEPENDENT_AMBULATORY_CARE_PROVIDER_SITE_OTHER): Payer: Self-pay | Admitting: Family Medicine

## 2024-05-01 ENCOUNTER — Ambulatory Visit (INDEPENDENT_AMBULATORY_CARE_PROVIDER_SITE_OTHER): Payer: Self-pay | Admitting: Family Medicine

## 2024-05-01 VITALS — BP 100/61 | HR 90 | Temp 98.6°F | Ht 68.0 in | Wt 142.0 lb

## 2024-05-01 DIAGNOSIS — Z6821 Body mass index (BMI) 21.0-21.9, adult: Secondary | ICD-10-CM

## 2024-05-01 DIAGNOSIS — F50811 Binge eating disorder, moderate: Secondary | ICD-10-CM

## 2024-05-01 DIAGNOSIS — E7849 Other hyperlipidemia: Secondary | ICD-10-CM

## 2024-05-01 DIAGNOSIS — E66811 Obesity, class 1: Secondary | ICD-10-CM

## 2024-05-01 DIAGNOSIS — E559 Vitamin D deficiency, unspecified: Secondary | ICD-10-CM

## 2024-05-01 MED ORDER — VITAMIN D (ERGOCALCIFEROL) 1.25 MG (50000 UNIT) PO CAPS: 50000.0000 [IU] | ORAL_CAPSULE | ORAL | 0 refills | Status: DC

## 2024-05-01 MED ORDER — ATORVASTATIN CALCIUM 20 MG PO TABS: 20.0000 mg | ORAL_TABLET | Freq: Every day | ORAL | 0 refills | Status: AC

## 2024-05-01 MED ORDER — LISDEXAMFETAMINE DIMESYLATE 20 MG PO CAPS: 20.0000 mg | ORAL_CAPSULE | Freq: Every day | ORAL | 0 refills | Status: DC

## 2024-05-01 MED ORDER — TOPIRAMATE 50 MG PO TABS: 50.0000 mg | ORAL_TABLET | Freq: Every day | ORAL | 0 refills | Status: DC

## 2024-05-01 NOTE — Progress Notes (Signed)
 "  SUBJECTIVE:  Chief Complaint: Obesity  Interim History: Patient passed her finals and has 1 semester left of business undergrad.  She has been off Wegovy  for over a month now.  She has been on topiramate  and lomaira  for about a month.  She is feeling hungry all the time which she knows isn't actual hunger.  She is noticing a change in the taste of her diet coke.  She is feeling frustrated and disheartened that the drive to binge eat has not very significant.  Planning to go to her husband's family's house and then her sisters for Christmas.  Annette Blevins is here to discuss her progress with her obesity treatment plan. She is on the Category 3 Plan and states she is following her eating plan approximately 50 % of the time. She states she is exercising 60 minutes 1 times per week.   OBJECTIVE: Visit Diagnoses: Problem List Items Addressed This Visit       Other   Vitamin D  deficiency - Primary   Relevant Medications   Vitamin D , Ergocalciferol , (DRISDOL ) 1.25 MG (50000 UNIT) CAPS capsule   Other hyperlipidemia   Relevant Medications   atorvastatin  (LIPITOR) 20 MG tablet   Class 1 obesity with body mass index (BMI) of 34.0 to 34.9 in adult   Relevant Medications   topiramate  (TOPAMAX ) 50 MG tablet   lisdexamfetamine  (VYVANSE ) 20 MG capsule   Other Visit Diagnoses       Moderate binge-eating disorder       Relevant Medications   lisdexamfetamine  (VYVANSE ) 20 MG capsule     BMI 21.0-21.9, adult           Vitals Temp: 98.6 F (37 C) BP: 100/61 Pulse Rate: 90 SpO2: 95 %   Anthropometric Measurements Height: 5' 8 (1.727 m) Weight: 142 lb (64.4 kg) BMI (Calculated): 21.6 Weight at Last Visit: 138 lb Weight Lost Since Last Visit: 0 Weight Gained Since Last Visit: 4 Starting Weight: 227 lb Total Weight Loss (lbs): 85 lb (38.6 kg)   Body Composition  Body Fat %: 28.2 % Fat Mass (lbs): 40.2 lbs Muscle Mass (lbs): 96.8 lbs Total Body Water (lbs): 71.8 lbs Visceral Fat  Rating : 2   Other Clinical Data Today's Visit #: 26 Starting Date: 01/20/22 Comments: Cat 3     ASSESSMENT AND PLAN: Assessment & Plan Vitamin D  deficiency On prescription strength vitamin D  and needs refill today.  Patient is paying out-of-pocket so we discussed possibility of transitioning over to over-the-counter if necessary for which patient will need to take 5000 international units daily. Other hyperlipidemia Last cholesterol panel done in March showing still elevated LDL at 142.  Needs refill of statin today with no change in dosage.  Will repeat labs at next appointment and adjust medications if necessary after seeing results Moderate binge-eating disorder Tolerating Vyvanse  and needs refill today of same dosage.  PDMP checked with no concerns.  Will follow-up at next appointment as to whether or not patient is still feeling the efficacy of this dosage. BMI 21.0-21.9, adult  Class 1 obesity with serious comorbidity and body mass index (BMI) of 34.0 to 34.9 in adult, unspecified obesity type    Diet: Wilsie is currently in the action stage of change. As such, her goal is to continue with weight loss efforts and has agreed to the Category 3 Plan.   Exercise:  For substantial health benefits, adults should do at least 150 minutes (2 hours and 30 minutes) a week of moderate-intensity,  or 75 minutes (1 hour and 15 minutes) a week of vigorous-intensity aerobic physical activity, or an equivalent combination of moderate- and vigorous-intensity aerobic activity. Aerobic activity should be performed in episodes of at least 10 minutes, and preferably, it should be spread throughout the week.  Behavior Modification:  We discussed the following Behavioral Modification Strategies today: increasing lean protein intake, decreasing simple carbohydrates, meal planning and cooking strategies, better snacking choices, and holiday eating strategies. We discussed various medication options to  help Stuart with her weight loss efforts and we both agreed to continue Vyvanse .  No follow-ups on file.   She was informed of the importance of frequent follow up visits to maximize her success with intensive lifestyle modifications for her multiple health conditions.  Attestation Statements:   Reviewed by clinician on day of visit: allergies, medications, problem list, medical history, surgical history, family history, social history, and previous encounter notes.     Adelita Cho, MD "

## 2024-05-05 ENCOUNTER — Other Ambulatory Visit (HOSPITAL_COMMUNITY): Payer: Self-pay

## 2024-05-14 NOTE — Assessment & Plan Note (Signed)
 Last cholesterol panel done in March showing still elevated LDL at 142.  Needs refill of statin today with no change in dosage.  Will repeat labs at next appointment and adjust medications if necessary after seeing results

## 2024-05-14 NOTE — Assessment & Plan Note (Signed)
 On prescription strength vitamin D  and needs refill today.  Patient is paying out-of-pocket so we discussed possibility of transitioning over to over-the-counter if necessary for which patient will need to take 5000 international units daily.

## 2024-05-15 ENCOUNTER — Other Ambulatory Visit (HOSPITAL_COMMUNITY): Payer: Self-pay

## 2024-06-01 ENCOUNTER — Other Ambulatory Visit (INDEPENDENT_AMBULATORY_CARE_PROVIDER_SITE_OTHER): Payer: Self-pay | Admitting: Family Medicine

## 2024-06-01 DIAGNOSIS — Z6834 Body mass index (BMI) 34.0-34.9, adult: Secondary | ICD-10-CM

## 2024-06-06 ENCOUNTER — Encounter (INDEPENDENT_AMBULATORY_CARE_PROVIDER_SITE_OTHER): Payer: Self-pay | Admitting: Family Medicine

## 2024-06-06 ENCOUNTER — Ambulatory Visit (INDEPENDENT_AMBULATORY_CARE_PROVIDER_SITE_OTHER): Payer: Self-pay | Admitting: Family Medicine

## 2024-06-06 VITALS — BP 109/63 | HR 80 | Temp 98.1°F | Ht 68.0 in | Wt 151.0 lb

## 2024-06-06 DIAGNOSIS — E66811 Obesity, class 1: Secondary | ICD-10-CM

## 2024-06-06 DIAGNOSIS — F50811 Binge eating disorder, moderate: Secondary | ICD-10-CM

## 2024-06-06 DIAGNOSIS — E559 Vitamin D deficiency, unspecified: Secondary | ICD-10-CM

## 2024-06-06 DIAGNOSIS — Z6822 Body mass index (BMI) 22.0-22.9, adult: Secondary | ICD-10-CM

## 2024-06-06 DIAGNOSIS — Z6834 Body mass index (BMI) 34.0-34.9, adult: Secondary | ICD-10-CM

## 2024-06-06 MED ORDER — TOPIRAMATE 50 MG PO TABS: 50.0000 mg | ORAL_TABLET | Freq: Every day | ORAL | 0 refills | Status: AC

## 2024-06-06 MED ORDER — LISDEXAMFETAMINE DIMESYLATE 20 MG PO CAPS: 20.0000 mg | ORAL_CAPSULE | Freq: Every day | ORAL | 0 refills | Status: AC

## 2024-06-06 MED ORDER — VITAMIN D (ERGOCALCIFEROL) 1.25 MG (50000 UNIT) PO CAPS: 50000.0000 [IU] | ORAL_CAPSULE | ORAL | 0 refills | Status: AC

## 2024-06-06 NOTE — Progress Notes (Signed)
 "  SUBJECTIVE:  Chief Complaint: Obesity  Interim History: Patient moved and school started last week and the holidays allowed for less structure and consistency of food intake.  She was nonetheless able to get her nutrition infrequently.  Days she isn't following the plan and prepping and planning she mentions her intake is variable.  This is occurring about 3 days out the week. She is has been having issues getting her topiramate  filled and she can tell when she isn't taking it.  Annette Blevins is here to discuss her progress with her obesity treatment plan. She is on the Category 3 Plan and states she is following her eating plan approximately 80 % of the time. She states she is exercising 60 minutes 2 times per week.   OBJECTIVE: Visit Diagnoses: Problem List Items Addressed This Visit       Other   Vitamin D  deficiency   Relevant Medications   Vitamin D , Ergocalciferol , (DRISDOL ) 1.25 MG (50000 UNIT) CAPS capsule   Class 1 obesity with body mass index (BMI) of 34.0 to 34.9 in adult   Relevant Medications   topiramate  (TOPAMAX ) 50 MG tablet   lisdexamfetamine  (VYVANSE ) 20 MG capsule    Vitals Temp: 98.1 F (36.7 C) BP: 109/63 Pulse Rate: 80 SpO2: 98 %   Anthropometric Measurements Height: 5' 8 (1.727 m) Weight: 151 lb (68.5 kg) BMI (Calculated): 22.96 Weight at Last Visit: 142 lb Weight Lost Since Last Visit: 0 Weight Gained Since Last Visit: 9 Starting Weight: 227 lb Total Weight Loss (lbs): 76 lb (34.5 kg)   Body Composition  Body Fat %: 29.9 % Fat Mass (lbs): 45.2 lbs Muscle Mass (lbs): 100.4 lbs Total Body Water (lbs): 74.4 lbs Visceral Fat Rating : 2   Other Clinical Data Today's Visit #: 76 Starting Date: 01/20/22 Comments: Cat 3     ASSESSMENT AND PLAN: Assessment & Plan Vitamin D  deficiency Tolerating prescription strength vitamin D  and needs a refill today.  Will need labs at next appointment to assess vitamin D  response to prescription strength  supplementation.  No nausea, vomiting, or muscle weakness reported. Moderate binge-eating disorder Patient reports she is unclear as to whether or not the Vyvanse  dosage is enough at this current time.  Thinks that would be able to better judge dosage after holidays.  Will refill still Vyvanse  at current dose and reevaluate change in dosage at next appointment.  PDMP was checked with no concerns. BMI 22.0-22.9, adult  Class 1 obesity with serious comorbidity and body mass index (BMI) of 34.0 to 34.9 in adult, unspecified obesity type Patient is tolerating topiramate  in the evenings for assistance with craving control.  Needs refill today.  No side effects of topiramate  mention.  Will continue at current dosage.  Refill sent to pharmacy.   Diet: Annette Blevins is currently in the action stage of change. As such, her goal is to continue with weight loss efforts and has agreed to the Category 3 Plan.   Exercise:  For substantial health benefits, adults should do at least 150 minutes (2 hours and 30 minutes) a week of moderate-intensity, or 75 minutes (1 hour and 15 minutes) a week of vigorous-intensity aerobic physical activity, or an equivalent combination of moderate- and vigorous-intensity aerobic activity. Aerobic activity should be performed in episodes of at least 10 minutes, and preferably, it should be spread throughout the week.  Behavior Modification:  We discussed the following Behavioral Modification Strategies today: increasing lean protein intake, decreasing simple carbohydrates, increasing vegetables, meal planning  and cooking strategies, keeping healthy foods in the home, and planning for success.   Return in about 4 weeks (around 07/04/2024).   She was informed of the importance of frequent follow up visits to maximize her success with intensive lifestyle modifications for her multiple health conditions.  Attestation Statements:   Reviewed by clinician on day of visit: allergies,  medications, problem list, medical history, surgical history, family history, social history, and previous encounter notes.     Adelita Cho, MD "

## 2024-06-14 NOTE — Assessment & Plan Note (Signed)
 Patient is tolerating topiramate  in the evenings for assistance with craving control.  Needs refill today.  No side effects of topiramate  mention.  Will continue at current dosage.  Refill sent to pharmacy.

## 2024-06-14 NOTE — Assessment & Plan Note (Signed)
 Tolerating prescription strength vitamin D  and needs a refill today.  Will need labs at next appointment to assess vitamin D  response to prescription strength supplementation.  No nausea, vomiting, or muscle weakness reported.

## 2024-06-18 ENCOUNTER — Other Ambulatory Visit (HOSPITAL_COMMUNITY): Payer: Self-pay

## 2024-06-19 ENCOUNTER — Other Ambulatory Visit: Payer: Self-pay

## 2024-07-04 ENCOUNTER — Ambulatory Visit (INDEPENDENT_AMBULATORY_CARE_PROVIDER_SITE_OTHER): Payer: Self-pay | Admitting: Family Medicine
